# Patient Record
Sex: Female | Born: 1990 | Race: Black or African American | Hispanic: No | Marital: Single | Smoking: Never smoker
Health system: Southern US, Community
[De-identification: ages and names within clinical notes are randomized; demographics above are authoritative.]

## PROBLEM LIST (undated history)

## (undated) DIAGNOSIS — Z789 Other specified health status: Secondary | ICD-10-CM

## (undated) HISTORY — DX: Other specified health status: Z78.9

## (undated) HISTORY — PX: HERNIA REPAIR: SHX51

---

## 2011-10-06 ENCOUNTER — Emergency Department (HOSPITAL_COMMUNITY)
Admission: EM | Admit: 2011-10-06 | Discharge: 2011-10-06 | Disposition: A | Discharge status: Home / Self Care | Payer: BC Managed Care – PPO | Attending: Emergency Medicine | Admitting: Emergency Medicine

## 2011-10-06 ENCOUNTER — Encounter: Payer: Self-pay | Admitting: Emergency Medicine

## 2011-10-06 DIAGNOSIS — J111 Influenza due to unidentified influenza virus with other respiratory manifestations: Secondary | ICD-10-CM | POA: Insufficient documentation

## 2011-10-06 MED ORDER — IBUPROFEN 800 MG PO TABS
800.0000 mg | ORAL_TABLET | Freq: Once | ORAL | Status: AC
  Administered 2011-10-06: 800 mg via ORAL
  Filled 2011-10-06: qty 1

## 2011-10-06 MED ORDER — DIPHENHYDRAMINE HCL 25 MG PO CAPS
25.0000 mg | ORAL_CAPSULE | Freq: Once | ORAL | Status: AC
  Administered 2011-10-06: 25 mg via ORAL
  Filled 2011-10-06: qty 1

## 2011-10-06 MED ORDER — ALBUTEROL SULFATE HFA 108 (90 BASE) MCG/ACT IN AERS: 2.0000 | INHALATION_SPRAY | RESPIRATORY_TRACT | Status: DC | PRN

## 2011-10-06 NOTE — ED Notes (Signed)
Not feeling well for the past few days now states that she is getting better today, no fever at home, took some meds a few days ago

## 2011-10-07 NOTE — ED Provider Notes (Signed)
Medical screening examination/treatment/procedure(s) were performed by non-physician practitioner and as supervising physician I was immediately available for consultation/collaboration.   Nat Christen, MD 10/07/11 1318

## 2011-10-07 NOTE — ED Provider Notes (Signed)
History     CSN: [id number] Arrival date & time: 10/06/2011  2:57 PM   First MD Initiated Contact with Patient 10/06/11 1719      Chief Complaint  Patient presents with  . Cough  . Generalized Body Aches    (Consider location/radiation/quality/duration/timing/severity/associated sxs/prior treatment) HPI  Patient presents to emergency department complaining of a three-day history gradual onset of cough, runny nose, body aches, nasal congestion. Patient states she took over the counter cough and cold medication without improvement of symptoms earlier today. Patient denies aggravating or alleviating factors. Symptoms were gradual onset, persistent, and unchanging. Patient denies known fevers, chills, headache, neck stiffness, chest pain, or shortness of breath. Patient has no known medical problems and takes no medicines on a regular basis.  History reviewed. No pertinent past medical history.  History reviewed. No pertinent past surgical history.  No family history on file.  History  Substance Use Topics  . Smoking status: Not on file  . Smokeless tobacco: Not on file  . Alcohol Use: Not on file    OB History    Grav Para Term Preterm Abortions TAB SAB Ect Mult Living                  Review of Systems  All other systems reviewed and are negative.    Allergies  Review of patient's allergies indicates no known allergies.  Home Medications   Current Outpatient Rx  Name Route Sig Dispense Refill  . NYQUIL PO Oral Take 2 capsules by mouth every 6 (six) hours as needed. For cough.     . DAYQUIL PO Oral Take 30 mLs by mouth every 6 (six) hours as needed. For cough/pain.     . ALBUTEROL SULFATE HFA 108 (90 BASE) MCG/ACT IN AERS Inhalation Inhale 2 puffs into the lungs every 4 (four) hours as needed for wheezing. 1 Inhaler 0    BP 109/73  Pulse 81  Temp(Src) 98.9 F (37.2 C) (Oral)  Resp 17  SpO2 100%  Physical Exam  Vitals reviewed. Constitutional: She is  oriented to person, place, and time. She appears well-developed and well-nourished. No distress.  HENT:  Head: Normocephalic and atraumatic.  Right Ear: External ear normal.  Left Ear: External ear normal.  Nose: Nose normal.  Mouth/Throat: No oropharyngeal exudate.       Mild erythema of posterior pharynx and tonsils no tonsillar exudate or enlargement. Patent airway. Swallowing secretions well  Eyes: Conjunctivae and EOM are normal. Pupils are equal, round, and reactive to light.  Neck: Normal range of motion. Neck supple.  Cardiovascular: Normal rate, regular rhythm and normal heart sounds.  Exam reveals no gallop and no friction rub.   No murmur heard. Pulmonary/Chest: Effort normal and breath sounds normal. No respiratory distress. She has no wheezes. She has no rales. She exhibits no tenderness.  Abdominal: Soft. She exhibits no distension and no mass. There is no tenderness. There is no rebound and no guarding.  Lymphadenopathy:    She has no cervical adenopathy.  Neurological: She is alert and oriented to person, place, and time. She has normal reflexes.  Skin: Skin is warm and dry. No rash noted. She is not diaphoretic.  Psychiatric: She has a normal mood and affect.    ED Course  Procedures (including critical care time)  Labs Reviewed - No data to display No results found.   1. Influenza       MDM  Patient is afebrile and nontoxic-appearing. Patient has no  comorbidities. Patient complaining of flulike symptoms and appropriate for outpatient management.        Jenness [place], [place] 10/07/11 [phone]

## 2015-07-01 LAB — OB RESULTS CONSOLE GC/CHLAMYDIA
Chlamydia: NEGATIVE
Gonorrhea: NEGATIVE

## 2015-07-01 LAB — OB RESULTS CONSOLE HIV ANTIBODY (ROUTINE TESTING): HIV: NONREACTIVE

## 2015-07-01 LAB — OB RESULTS CONSOLE RUBELLA ANTIBODY, IGM: RUBELLA: IMMUNE

## 2015-07-01 LAB — OB RESULTS CONSOLE HEPATITIS B SURFACE ANTIGEN: HEP B S AG: NEGATIVE

## 2015-07-01 LAB — OB RESULTS CONSOLE RPR: RPR: NONREACTIVE

## 2015-07-01 LAB — OB RESULTS CONSOLE ABO/RH: RH TYPE: POSITIVE

## 2015-07-01 LAB — OB RESULTS CONSOLE HGB/HCT, BLOOD: Hemoglobin: 11.8 g/dL

## 2015-07-01 LAB — OB RESULTS CONSOLE ANTIBODY SCREEN: Antibody Screen: NEGATIVE

## 2015-07-01 LAB — OB RESULTS CONSOLE PLATELET COUNT: PLATELETS: 234 10*3/uL

## 2015-07-22 ENCOUNTER — Encounter: Payer: Self-pay | Admitting: Family Medicine

## 2015-07-22 ENCOUNTER — Ambulatory Visit (INDEPENDENT_AMBULATORY_CARE_PROVIDER_SITE_OTHER): Payer: BC Managed Care – PPO | Admitting: Family Medicine

## 2015-07-22 VITALS — BP 115/77 | HR 91 | Ht 62.0 in | Wt 163.0 lb

## 2015-07-22 DIAGNOSIS — Z23 Encounter for immunization: Secondary | ICD-10-CM | POA: Diagnosis not present

## 2015-07-22 DIAGNOSIS — Z3400 Encounter for supervision of normal first pregnancy, unspecified trimester: Secondary | ICD-10-CM | POA: Insufficient documentation

## 2015-07-22 DIAGNOSIS — L0292 Furuncle, unspecified: Secondary | ICD-10-CM

## 2015-07-22 DIAGNOSIS — Z3401 Encounter for supervision of normal first pregnancy, first trimester: Secondary | ICD-10-CM

## 2015-07-22 MED ORDER — AMOXICILLIN 500 MG PO CAPS: 500.0000 mg | ORAL_CAPSULE | Freq: Three times a day (TID) | ORAL | Status: DC

## 2015-07-22 NOTE — Patient Instructions (Signed)
Second Trimester of Pregnancy The second trimester is from week 13 through week 28, months 4 through 6. The second trimester is often a time when you feel your best. Your body has also adjusted to being pregnant, and you begin to feel better physically. Usually, morning sickness has lessened or quit completely, you may have more energy, and you may have an increase in appetite. The second trimester is also a time when the fetus is growing rapidly. At the end of the sixth month, the fetus is about 9 inches long and weighs about 1 pounds. You will likely begin to feel the baby move (quickening) between 18 and 20 weeks of the pregnancy. BODY CHANGES Your body goes through many changes during pregnancy. The changes vary from woman to woman.   Your weight will continue to increase. You will notice your lower abdomen bulging out.  You may begin to get stretch marks on your hips, abdomen, and breasts.  You may develop headaches that can be relieved by medicines approved by your health care provider.  You may urinate more often because the fetus is pressing on your bladder.  You may develop or continue to have heartburn as a result of your pregnancy.  You may develop constipation because certain hormones are causing the muscles that push waste through your intestines to slow down.  You may develop hemorrhoids or swollen, bulging veins (varicose veins).  You may have back pain because of the weight gain and pregnancy hormones relaxing your joints between the bones in your pelvis and as a result of a shift in weight and the muscles that support your balance.  Your breasts will continue to grow and be tender.  Your gums may bleed and may be sensitive to brushing and flossing.  Dark spots or blotches (chloasma, mask of pregnancy) may develop on your face. This will likely fade after the baby is born.  A dark line from your belly button to the pubic area (linea nigra) may appear. This will likely  fade after the baby is born.  You may have changes in your hair. These can include thickening of your hair, rapid growth, and changes in texture. Some women also have hair loss during or after pregnancy, or hair that feels dry or thin. Your hair will most likely return to normal after your baby is born. WHAT TO EXPECT AT YOUR PRENATAL VISITS During a routine prenatal visit:  You will be weighed to make sure you and the fetus are growing normally.  Your blood pressure will be taken.  Your abdomen will be measured to track your baby's growth.  The fetal heartbeat will be listened to.  Any test results from the previous visit will be discussed. Your health care provider may ask you:  How you are feeling.  If you are feeling the baby move.  If you have had any abnormal symptoms, such as leaking fluid, bleeding, severe headaches, or abdominal cramping.  If you have any questions. Other tests that may be performed during your second trimester include:  Blood tests that check for:  Low iron levels (anemia).  Gestational diabetes (between 24 and 28 weeks).  Rh antibodies.  Urine tests to check for infections, diabetes, or protein in the urine.  An ultrasound to confirm the proper growth and development of the baby.  An amniocentesis to check for possible genetic problems.  Fetal screens for spina bifida and Down syndrome. HOME CARE INSTRUCTIONS   Avoid all smoking, herbs, alcohol, and unprescribed   drugs. These chemicals affect the formation and growth of the baby.  Follow your health care provider's instructions regarding medicine use. There are medicines that are either safe or unsafe to take during pregnancy.  Exercise only as directed by your health care provider. Experiencing uterine cramps is a good sign to stop exercising.  Continue to eat regular, healthy meals.  Wear a good support bra for breast tenderness.  Do not use hot tubs, steam rooms, or saunas.  Wear  your seat belt at all times when driving.  Avoid raw meat, uncooked cheese, cat litter boxes, and soil used by cats. These carry germs that can cause birth defects in the baby.  Take your prenatal vitamins.  Try taking a stool softener (if your health care provider approves) if you develop constipation. Eat more high-fiber foods, such as fresh vegetables or fruit and whole grains. Drink plenty of fluids to keep your urine clear or pale yellow.  Take warm sitz baths to soothe any pain or discomfort caused by hemorrhoids. Use hemorrhoid cream if your health care provider approves.  If you develop varicose veins, wear support hose. Elevate your feet for 15 minutes, 3-4 times a day. Limit salt in your diet.  Avoid heavy lifting, wear low heel shoes, and practice good posture.  Rest with your legs elevated if you have leg cramps or low back pain.  Visit your dentist if you have not gone yet during your pregnancy. Use a soft toothbrush to brush your teeth and be gentle when you floss.  A sexual relationship may be continued unless your health care provider directs you otherwise.  Continue to go to all your prenatal visits as directed by your health care provider. SEEK MEDICAL CARE IF:   You have dizziness.  You have mild pelvic cramps, pelvic pressure, or nagging pain in the abdominal area.  You have persistent nausea, vomiting, or diarrhea.  You have a bad smelling vaginal discharge.  You have pain with urination. SEEK IMMEDIATE MEDICAL CARE IF:   You have a fever.  You are leaking fluid from your vagina.  You have spotting or bleeding from your vagina.  You have severe abdominal cramping or pain.  You have rapid weight gain or loss.  You have shortness of breath with chest pain.  You notice sudden or extreme swelling of your face, hands, ankles, feet, or legs.  You have not felt your baby move in over an hour.  You have severe headaches that do not go away with  medicine.  You have vision changes. Document Released: 10/12/2001 Document Revised: 10/23/2013 Document Reviewed: 12/19/2012 [employer] Patient Information 2015 [employer]. This information is not intended to replace advice given to you by your health care provider. Make sure you discuss any questions you have with your health care provider.  Breastfeeding Deciding to breastfeed is one of the best choices you can make for you and your baby. A change in hormones during pregnancy causes your breast tissue to grow and increases the number and size of your milk ducts. These hormones also allow proteins, sugars, and fats from your blood supply to make breast milk in your milk-producing glands. Hormones prevent breast milk from being released before your baby is born as well as prompt milk flow after birth. Once breastfeeding has begun, thoughts of your baby, as well as his or her sucking or crying, can stimulate the release of milk from your milk-producing glands.  BENEFITS OF BREASTFEEDING For Your Baby  Your first   milk (colostrum) helps your baby's digestive system function better.   There are antibodies in your milk that help your baby fight off infections.   Your baby has a lower incidence of asthma, allergies, and sudden infant death syndrome.   The nutrients in breast milk are better for your baby than infant formulas and are designed uniquely for your baby's needs.   Breast milk improves your baby's brain development.   Your baby is less likely to develop other conditions, such as childhood obesity, asthma, or type 2 diabetes mellitus.  For You   Breastfeeding helps to create a very special bond between you and your baby.   Breastfeeding is convenient. Breast milk is always available at the correct temperature and costs nothing.   Breastfeeding helps to burn calories and helps you lose the weight gained during pregnancy.   Breastfeeding makes your uterus contract to its  prepregnancy size faster and slows bleeding (lochia) after you give birth.   Breastfeeding helps to lower your risk of developing type 2 diabetes mellitus, osteoporosis, and breast or ovarian cancer later in life. SIGNS THAT YOUR BABY IS HUNGRY Early Signs of Hunger  Increased alertness or activity.  Stretching.  Movement of the head from side to side.  Movement of the head and opening of the mouth when the corner of the mouth or cheek is stroked (rooting).  Increased sucking sounds, smacking lips, cooing, sighing, or squeaking.  Hand-to-mouth movements.  Increased sucking of fingers or hands. Late Signs of Hunger  Fussing.  Intermittent crying. Extreme Signs of Hunger Signs of extreme hunger will require calming and consoling before your baby will be able to breastfeed successfully. Do not wait for the following signs of extreme hunger to occur before you initiate breastfeeding:   Restlessness.  A loud, strong cry.   Screaming. BREASTFEEDING BASICS Breastfeeding Initiation  Find a comfortable place to sit or lie down, with your neck and back well supported.  Place a pillow or rolled up blanket under your baby to bring him or her to the level of your breast (if you are seated). Nursing pillows are specially designed to help support your arms and your baby while you breastfeed.  Make sure that your baby's abdomen is facing your abdomen.   Gently massage your breast. With your fingertips, massage from your chest wall toward your nipple in a circular motion. This encourages milk flow. You may need to continue this action during the feeding if your milk flows slowly.  Support your breast with 4 fingers underneath and your thumb above your nipple. Make sure your fingers are well away from your nipple and your baby's mouth.   Stroke your baby's lips gently with your finger or nipple.   When your baby's mouth is open wide enough, quickly bring your baby to your breast,  placing your entire nipple and as much of the colored area around your nipple (areola) as possible into your baby's mouth.   More areola should be visible above your baby's upper lip than below the lower lip.   Your baby's tongue should be between his or her lower gum and your breast.   Ensure that your baby's mouth is correctly positioned around your nipple (latched). Your baby's lips should create a seal on your breast and be turned out (everted).  It is common for your baby to suck about 2-3 minutes in order to start the flow of breast milk. Latching Teaching your baby how to latch on to your breast   properly is very important. An improper latch can cause nipple pain and decreased milk supply for you and poor weight gain in your baby. Also, if your baby is not latched onto your nipple properly, he or she may swallow some air during feeding. This can make your baby fussy. Burping your baby when you switch breasts during the feeding can help to get rid of the air. However, teaching your baby to latch on properly is still the best way to prevent fussiness from swallowing air while breastfeeding. Signs that your baby has successfully latched on to your nipple:    Silent tugging or silent sucking, without causing you pain.   Swallowing heard between every 3-4 sucks.    Muscle movement above and in front of his or her ears while sucking.  Signs that your baby has not successfully latched on to nipple:   Sucking sounds or smacking sounds from your baby while breastfeeding.  Nipple pain. If you think your baby has not latched on correctly, slip your finger into the corner of your baby's mouth to break the suction and place it between your baby's gums. Attempt breastfeeding initiation again. Signs of Successful Breastfeeding Signs from your baby:   A gradual decrease in the number of sucks or complete cessation of sucking.   Falling asleep.   Relaxation of his or her body.    Retention of a small amount of milk in his or her mouth.   Letting go of your breast by himself or herself. Signs from you:  Breasts that have increased in firmness, weight, and size 1-3 hours after feeding.   Breasts that are softer immediately after breastfeeding.  Increased milk volume, as well as a change in milk consistency and color by the fifth day of breastfeeding.   Nipples that are not sore, cracked, or bleeding. Signs That Your Baby is Getting Enough Milk  Wetting at least 3 diapers in a 24-hour period. The urine should be clear and pale yellow by age 5 days.  At least 3 stools in a 24-hour period by age 5 days. The stool should be soft and yellow.  At least 3 stools in a 24-hour period by age 7 days. The stool should be seedy and yellow.  No loss of weight greater than 10% of birth weight during the first 3 days of age.  Average weight gain of 4-7 ounces (113-198 g) per week after age 4 days.  Consistent daily weight gain by age 5 days, without weight loss after the age of 2 weeks. After a feeding, your baby may spit up a small amount. This is common. BREASTFEEDING FREQUENCY AND DURATION Frequent feeding will help you make more milk and can prevent sore nipples and breast engorgement. Breastfeed when you feel the need to reduce the fullness of your breasts or when your baby shows signs of hunger. This is called "breastfeeding on demand." Avoid introducing a pacifier to your baby while you are working to establish breastfeeding (the first 4-6 weeks after your baby is born). After this time you may choose to use a pacifier. Research has shown that pacifier use during the first year of a baby's life decreases the risk of sudden infant death syndrome (SIDS). Allow your baby to feed on each breast as long as he or she wants. Breastfeed until your baby is finished feeding. When your baby unlatches or falls asleep while feeding from the first breast, offer the second breast.  Because newborns are often sleepy in the   first few weeks of life, you may need to awaken your baby to get him or her to feed. Breastfeeding times will vary from baby to baby. However, the following rules can serve as a guide to help you ensure that your baby is properly fed:  Newborns (babies 4 weeks of age or younger) may breastfeed every 1-3 hours.  Newborns should not go longer than 3 hours during the day or 5 hours during the night without breastfeeding.  You should breastfeed your baby a minimum of 8 times in a 24-hour period until you begin to introduce solid foods to your baby at around 6 months of age. BREAST MILK PUMPING Pumping and storing breast milk allows you to ensure that your baby is exclusively fed your breast milk, even at times when you are unable to breastfeed. This is especially important if you are going back to work while you are still breastfeeding or when you are not able to be present during feedings. Your lactation consultant can give you guidelines on how long it is safe to store breast milk.  A breast pump is a machine that allows you to pump milk from your breast into a sterile bottle. The pumped breast milk can then be stored in a refrigerator or freezer. Some breast pumps are operated by hand, while others use electricity. Ask your lactation consultant which type will work best for you. Breast pumps can be purchased, but some hospitals and breastfeeding support groups lease breast pumps on a monthly basis. A lactation consultant can teach you how to hand express breast milk, if you prefer not to use a pump.  CARING FOR YOUR BREASTS WHILE YOU BREASTFEED Nipples can become dry, cracked, and sore while breastfeeding. The following recommendations can help keep your breasts moisturized and healthy:  Avoid using soap on your nipples.   Wear a supportive bra. Although not required, special nursing bras and tank tops are designed to allow access to your breasts for  breastfeeding without taking off your entire bra or top. Avoid wearing underwire-style bras or extremely tight bras.  Air dry your nipples for 3-4minutes after each feeding.   Use only cotton bra pads to absorb leaked breast milk. Leaking of breast milk between feedings is normal.   Use lanolin on your nipples after breastfeeding. Lanolin helps to maintain your skin's normal moisture barrier. If you use pure lanolin, you do not need to wash it off before feeding your baby again. Pure lanolin is not toxic to your baby. You may also hand express a few drops of breast milk and gently massage that milk into your nipples and allow the milk to air dry. In the first few weeks after giving birth, some women experience extremely full breasts (engorgement). Engorgement can make your breasts feel heavy, warm, and tender to the touch. Engorgement peaks within 3-5 days after you give birth. The following recommendations can help ease engorgement:  Completely empty your breasts while breastfeeding or pumping. You may want to start by applying warm, moist heat (in the shower or with warm water-soaked hand towels) just before feeding or pumping. This increases circulation and helps the milk flow. If your baby does not completely empty your breasts while breastfeeding, pump any extra milk after he or she is finished.  Wear a snug bra (nursing or regular) or tank top for 1-2 days to signal your body to slightly decrease milk production.  Apply ice packs to your breasts, unless this is too uncomfortable for you.    Make sure that your baby is latched on and positioned properly while breastfeeding. If engorgement persists after 48 hours of following these recommendations, contact your health care provider or a lactation consultant. OVERALL HEALTH CARE RECOMMENDATIONS WHILE BREASTFEEDING  Eat healthy foods. Alternate between meals and snacks, eating 3 of each per day. Because what you eat affects your breast milk,  some of the foods may make your baby more irritable than usual. Avoid eating these foods if you are sure that they are negatively affecting your baby.  Drink milk, fruit juice, and water to satisfy your thirst (about 10 glasses a day).   Rest often, relax, and continue to take your prenatal vitamins to prevent fatigue, stress, and anemia.  Continue breast self-awareness checks.  Avoid chewing and smoking tobacco.  Avoid alcohol and drug use. Some medicines that may be harmful to your baby can pass through breast milk. It is important to ask your health care provider before taking any medicine, including all over-the-counter and prescription medicine as well as vitamin and herbal supplements. It is possible to become pregnant while breastfeeding. If birth control is desired, ask your health care provider about options that will be safe for your baby. SEEK MEDICAL CARE IF:   You feel like you want to stop breastfeeding or have become frustrated with breastfeeding.  You have painful breasts or nipples.  Your nipples are cracked or bleeding.  Your breasts are red, tender, or warm.  You have a swollen area on either breast.  You have a fever or chills.  You have nausea or vomiting.  You have drainage other than breast milk from your nipples.  Your breasts do not become full before feedings by the fifth day after you give birth.  You feel sad and depressed.  Your baby is too sleepy to eat well.  Your baby is having trouble sleeping.   Your baby is wetting less than 3 diapers in a 24-hour period.  Your baby has less than 3 stools in a 24-hour period.  Your baby's skin or the white part of his or her eyes becomes yellow.   Your baby is not gaining weight by 5 days of age. SEEK IMMEDIATE MEDICAL CARE IF:   Your baby is overly tired (lethargic) and does not want to wake up and feed.  Your baby develops an unexplained fever. Document Released: 10/18/2005 Document Revised:  10/23/2013 Document Reviewed: 04/11/2013 [employer] Patient Information 2015 [employer]. This information is not intended to replace advice given to you by your health care provider. Make sure you discuss any questions you have with your health care provider.  

## 2015-07-22 NOTE — Assessment & Plan Note (Signed)
BABYSCRIPTS PATIENT: [x ] initial, [ x] 12, [ ] 20, [ ] 28, [ ] 32, [ ] 36, [ ] 38, [ ] 39, [ ] 40 

## 2015-07-22 NOTE — Progress Notes (Signed)
Subjective:  Lisa Vincent is a 24 y.o. G2P0010 at [redacted]w[redacted]d being seen today for ongoing prenatal care. She is transferring from [facility]. Has had labs. Needs first screen.  Patient reports headache.  Contractions: Not present.  Vag. Bleeding: None. Movement: Absent. Denies leaking of fluid.   The following portions of the patient's history were reviewed and updated as appropriate: allergies, current medications, past family history, past medical history, past social history, past surgical history and problem list.   Objective:   Filed Vitals:   07/22/15 1023 07/22/15 1026  BP: 115/77   Pulse: 91   Height:   (1.575 m)  Weight: 163 lb (73.936 kg)     Fetal Status: Fetal Heart Rate (bpm): 162   Movement: Absent     General:  Alert, oriented and cooperative. Patient is in no acute distress.  Skin: Skin is warm and dry. No rash noted.   Cardiovascular: Normal heart rate noted  Respiratory: Normal respiratory effort, no problems with respiration noted  Abdomen: Soft, gravid, appropriate for gestational age. Pain/Pressure: Absent     Pelvic: Vag. Bleeding: None Vag D/C Character: Thin    Extremities: Normal range of motion.  Edema: None  Mental Status: Normal mood and affect. Normal behavior. Normal judgment and thought content.  Skin: large 3 x 3 cm indurated area on left butt cheek--no area for I & D  Urinalysis: Urine Protein: Negative Urine Glucose: Negative  Assessment and Plan:  Pregnancy: G2P0010 at [redacted]w[redacted]d  1. Supervision of normal first pregnancy, antepartum, first trimester Continue routine prenatal care. Signed up for Baby Scripts - US Fetal Nuchal Translucency Measurement; Future - Flu Vaccine QUAD 36+ mos IM (Fluarix, Quad PF)  2. Boil - amoxicillin (AMOXIL) 500 MG capsule; Take 1 capsule (500 mg total) by mouth 3 (three) times daily.  Dispense: 21 capsule; Refill: 0   Please refer to After Visit Summary for other counseling recommendations.  Return in 4 weeks (on  08/19/2015).   Reva Bores, MD

## 2015-07-23 ENCOUNTER — Encounter: Payer: Self-pay | Admitting: *Deleted

## 2015-07-30 ENCOUNTER — Ambulatory Visit (HOSPITAL_COMMUNITY)
Admission: RE | Admit: 2015-07-30 | Discharge: 2015-07-30 | Disposition: A | Discharge status: Home / Self Care | Payer: BC Managed Care – PPO | Source: Ambulatory Visit | Attending: Family Medicine | Admitting: Family Medicine

## 2015-07-30 ENCOUNTER — Other Ambulatory Visit: Payer: Self-pay | Admitting: Family Medicine

## 2015-07-30 ENCOUNTER — Encounter (HOSPITAL_COMMUNITY): Payer: Self-pay

## 2015-07-30 ENCOUNTER — Ambulatory Visit (HOSPITAL_COMMUNITY): Admission: RE | Admit: 2015-07-30 | Payer: BC Managed Care – PPO | Source: Ambulatory Visit

## 2015-07-30 DIAGNOSIS — Z36 Encounter for antenatal screening of mother: Secondary | ICD-10-CM | POA: Diagnosis not present

## 2015-07-30 DIAGNOSIS — Z3A13 13 weeks gestation of pregnancy: Secondary | ICD-10-CM

## 2015-07-30 DIAGNOSIS — Z369 Encounter for antenatal screening, unspecified: Secondary | ICD-10-CM

## 2015-07-30 DIAGNOSIS — Z3401 Encounter for supervision of normal first pregnancy, first trimester: Secondary | ICD-10-CM

## 2015-07-30 NOTE — ED Notes (Signed)
Pt states she had some upper abdominal pain last night.  Denies bleeding.

## 2015-08-01 ENCOUNTER — Ambulatory Visit (HOSPITAL_COMMUNITY): Admission: RE | Admit: 2015-08-01 | Payer: BC Managed Care – PPO | Source: Ambulatory Visit

## 2015-08-01 ENCOUNTER — Ambulatory Visit (HOSPITAL_COMMUNITY)
Admission: RE | Admit: 2015-08-01 | Discharge: 2015-08-01 | Disposition: A | Discharge status: Home / Self Care | Payer: BC Managed Care – PPO | Source: Ambulatory Visit | Attending: Family Medicine | Admitting: Family Medicine

## 2015-08-01 ENCOUNTER — Other Ambulatory Visit (HOSPITAL_COMMUNITY): Payer: Self-pay | Admitting: Obstetrics and Gynecology

## 2015-08-01 ENCOUNTER — Encounter (HOSPITAL_COMMUNITY): Payer: Self-pay

## 2015-08-01 DIAGNOSIS — Z369 Encounter for antenatal screening, unspecified: Secondary | ICD-10-CM

## 2015-08-01 DIAGNOSIS — Z3A13 13 weeks gestation of pregnancy: Secondary | ICD-10-CM | POA: Diagnosis not present

## 2015-08-01 DIAGNOSIS — Z36 Encounter for antenatal screening of mother: Secondary | ICD-10-CM | POA: Diagnosis present

## 2015-08-01 DIAGNOSIS — Z3401 Encounter for supervision of normal first pregnancy, first trimester: Secondary | ICD-10-CM

## 2015-08-06 ENCOUNTER — Telehealth: Payer: Self-pay | Admitting: *Deleted

## 2015-08-06 DIAGNOSIS — Z3492 Encounter for supervision of normal pregnancy, unspecified, second trimester: Secondary | ICD-10-CM

## 2015-08-06 NOTE — Telephone Encounter (Signed)
MFM called requesting an order for Anatomy US the was scheduled in November, placed order per protocol.

## 2015-08-12 ENCOUNTER — Encounter: Payer: Self-pay | Admitting: Physician Assistant

## 2015-08-12 ENCOUNTER — Ambulatory Visit (INDEPENDENT_AMBULATORY_CARE_PROVIDER_SITE_OTHER): Payer: BC Managed Care – PPO | Admitting: Physician Assistant

## 2015-08-12 VITALS — BP 115/74 | HR 87 | Resp 18 | Ht 62.0 in | Wt 167.0 lb

## 2015-08-12 DIAGNOSIS — R51 Headache: Secondary | ICD-10-CM | POA: Diagnosis not present

## 2015-08-12 DIAGNOSIS — O26899 Other specified pregnancy related conditions, unspecified trimester: Secondary | ICD-10-CM | POA: Insufficient documentation

## 2015-08-12 DIAGNOSIS — G43009 Migraine without aura, not intractable, without status migrainosus: Secondary | ICD-10-CM

## 2015-08-12 DIAGNOSIS — O26892 Other specified pregnancy related conditions, second trimester: Secondary | ICD-10-CM | POA: Diagnosis not present

## 2015-08-12 DIAGNOSIS — R519 Headache, unspecified: Secondary | ICD-10-CM | POA: Insufficient documentation

## 2015-08-12 DIAGNOSIS — M62838 Other muscle spasm: Secondary | ICD-10-CM | POA: Diagnosis not present

## 2015-08-12 MED ORDER — CYCLOBENZAPRINE HCL 10 MG PO TABS: 10.0000 mg | ORAL_TABLET | Freq: Three times a day (TID) | ORAL | Status: DC | PRN

## 2015-08-12 NOTE — Progress Notes (Signed)
Patient ID: Lisa Vincent, female   DOB: August 20, 1991, 24 y.o.   MRN: [id number] History:  Lisa Vincent is a 24 y.o. G2P0010 who presents to clinic today for new evaluation of headache.  She has noticed more severe headaches in pregnancy but headaches in general started around age 78 with menses.  The worse one was left temporal but other times it is throughout the head.  There was throbbing.  One time she awoke from a nap with throbbing headache.  They last the rest of the day but get better with overnight sleep (up to 12 hours).  They are worse with movement.   Phonophobia much more noticeable that photophobia.  She denies nausea/vomiting.  No change in vision.  No warning of HA.   Prior to pregnancy - she had menstrual headaches every month.   Reports she gets HA from hunger or not drinking enough.  She also notes more HA with stress.   HIT6:55 Number of days in the last 4 weeks with:  Severe headache: 2 Moderate headache: 0 Mild headache: 23  No headache: 3  History reviewed. No pertinent past medical history.  Social History   Social History  . Marital Status: Single    Spouse Name: N/A  . Number of Children: N/A  . Years of Education: N/A   Occupational History  . Not on file.   Social History Main Topics  . Smoking status: Never Smoker   . Smokeless tobacco: Never Used  . Alcohol Use: No  . Drug Use: No  . Sexual Activity: Yes    Birth Control/ Protection: None   Other Topics Concern  . Not on file   Social History Narrative    Family History  Problem Relation Age of Onset  . Hypertension Mother   . Hypertension Maternal Aunt   . Hypertension Maternal Uncle   . Hypertension Maternal Grandmother     No Known Allergies  Current Outpatient Prescriptions on File Prior to Visit  Medication Sig Dispense Refill  . Prenat-FeCbn-FeAsp-Meth-FA-DHA (PRENATE MINI) 18-0.6-0.4-350 MG CAPS Take by mouth.     No current facility-administered medications on file prior to visit.      Review of Systems:  All pertinent positive/negative included in HPI, all other review of systems are negative  Objective:  Physical Exam BP 115/74 mmHg  Pulse 87  Resp 18  Ht  (1.575 m)  Wt 167 lb (75.751 kg)  BMI 30.54 kg/m2  LMP 04/27/2015 (LMP Unknown) CONSTITUTIONAL: Well-developed, well-nourished female in no acute distress.  EYES: EOM intact ENT: Normocephalic CARDIOVASCULAR: Regular rate and rhythm with no adventitious sounds.  RESPIRATORY: Normal rate. Clear to auscultation bilaterally.  ENDOCRINE: Normal thyroid.  MUSCULOSKELETAL: Normal ROM, strength equal bilaterally, significant bilat trap muscle spasm noted SKIN: Warm, dry without erythema  NEUROLOGICAL: Alert, oriented, CN II-XII grossly intact, Appropriate balance, No dysmetria, Sensation equal bilaterally, Romberg negative.   PSYCH: Normal behavior, mood   Assessment & Plan:  Assessment: 1. Migraine without aura and without status migrainosus, not intractable   2. Headache in pregnancy, antepartum, second trimester   3. Muscle spasm    Plan: Drink one bottle of water every morning on awakening.   Exercise discussed - early am walking outside is optimal Schedule discussed.  Try to keep everything as routine as possible: eating, sleeping, exercise, etc Flexeril rx - for acute treatment of HA AND for muscle spasm - category B safety in pregnancy Can use OTC Benadryl  and go to bed if flexeril  does not resolve HA (also Cat B in preg) Keep HA diary.  Watch for triggers  Follow-up in 6 weeks or sooner PRN  Bertram Denver, PA-C 08/12/2015 11:00 AM

## 2015-08-12 NOTE — Patient Instructions (Signed)

## 2015-08-27 ENCOUNTER — Ambulatory Visit (INDEPENDENT_AMBULATORY_CARE_PROVIDER_SITE_OTHER): Payer: BC Managed Care – PPO | Admitting: Certified Nurse Midwife

## 2015-08-27 VITALS — BP 127/83 | HR 94 | Wt 174.0 lb

## 2015-08-27 DIAGNOSIS — Z3482 Encounter for supervision of other normal pregnancy, second trimester: Secondary | ICD-10-CM

## 2015-08-27 DIAGNOSIS — Z3492 Encounter for supervision of normal pregnancy, unspecified, second trimester: Secondary | ICD-10-CM

## 2015-08-27 NOTE — Patient Instructions (Signed)

## 2015-09-09 ENCOUNTER — Ambulatory Visit (HOSPITAL_COMMUNITY)
Admission: RE | Admit: 2015-09-09 | Discharge: 2015-09-09 | Disposition: A | Discharge status: Home / Self Care | Payer: BC Managed Care – PPO | Source: Ambulatory Visit | Attending: Family Medicine | Admitting: Family Medicine

## 2015-09-09 ENCOUNTER — Other Ambulatory Visit: Payer: Self-pay | Admitting: Family Medicine

## 2015-09-09 DIAGNOSIS — Z3492 Encounter for supervision of normal pregnancy, unspecified, second trimester: Secondary | ICD-10-CM

## 2015-09-09 DIAGNOSIS — Z3A19 19 weeks gestation of pregnancy: Secondary | ICD-10-CM | POA: Insufficient documentation

## 2015-09-09 DIAGNOSIS — Z3402 Encounter for supervision of normal first pregnancy, second trimester: Secondary | ICD-10-CM

## 2015-09-09 DIAGNOSIS — Z36 Encounter for antenatal screening of mother: Secondary | ICD-10-CM | POA: Diagnosis not present

## 2015-09-09 LAB — QUAD SCREEN FOR MFM

## 2015-09-10 ENCOUNTER — Telehealth: Payer: Self-pay | Admitting: *Deleted

## 2015-09-10 NOTE — Telephone Encounter (Signed)
[facility], needed verification on patient last weight to be able to run the lab.  Informed of current weight.

## 2015-09-12 ENCOUNTER — Ambulatory Visit (HOSPITAL_COMMUNITY): Payer: BC Managed Care – PPO

## 2015-09-16 ENCOUNTER — Ambulatory Visit (INDEPENDENT_AMBULATORY_CARE_PROVIDER_SITE_OTHER): Payer: BC Managed Care – PPO | Admitting: Family Medicine

## 2015-09-16 VITALS — BP 121/78 | HR 81 | Wt 174.0 lb

## 2015-09-16 DIAGNOSIS — Z3402 Encounter for supervision of normal first pregnancy, second trimester: Secondary | ICD-10-CM

## 2015-09-16 NOTE — Patient Instructions (Addendum)
Breastfeeding Deciding to breastfeed is one of the best choices you can make for you and your baby. A change in hormones during pregnancy causes your breast tissue to grow and increases the number and size of your milk ducts. These hormones also allow proteins, sugars, and fats from your blood supply to make breast milk in your milk-producing glands. Hormones prevent breast milk from being released before your baby is born as well as prompt milk flow after birth. Once breastfeeding has begun, thoughts of your baby, as well as his or her sucking or crying, can stimulate the release of milk from your milk-producing glands.  BENEFITS OF BREASTFEEDING For Your Baby  Your first milk (colostrum) helps your baby's digestive system function better.  There are antibodies in your milk that help your baby fight off infections.  Your baby has a lower incidence of asthma, allergies, and sudden infant death syndrome.  The nutrients in breast milk are better for your baby than infant formulas and are designed uniquely for your baby's needs.  Breast milk improves your baby's brain development.  Your baby is less likely to develop other conditions, such as childhood obesity, asthma, or type 2 diabetes mellitus. For You  Breastfeeding helps to create a very special bond between you and your baby.  Breastfeeding is convenient. Breast milk is always available at the correct temperature and costs nothing.  Breastfeeding helps to burn calories and helps you lose the weight gained during pregnancy.  Breastfeeding makes your uterus contract to its prepregnancy size faster and slows bleeding (lochia) after you give birth.   Breastfeeding helps to lower your risk of developing type 2 diabetes mellitus, osteoporosis, and breast or ovarian cancer later in life. SIGNS THAT YOUR BABY IS HUNGRY Early Signs of Hunger  Increased alertness or activity.  Stretching.  Movement of the head from side to  side.  Movement of the head and opening of the mouth when the corner of the mouth or cheek is stroked (rooting).  Increased sucking sounds, smacking lips, cooing, sighing, or squeaking.  Hand-to-mouth movements.  Increased sucking of fingers or hands. Late Signs of Hunger  Fussing.  Intermittent crying. Extreme Signs of Hunger Signs of extreme hunger will require calming and consoling before your baby will be able to breastfeed successfully. Do not wait for the following signs of extreme hunger to occur before you initiate breastfeeding:  Restlessness.  A loud, strong cry.  Screaming. BREASTFEEDING BASICS Breastfeeding Initiation  Find a comfortable place to sit or lie down, with your neck and back well supported.  Place a pillow or rolled up blanket under your baby to bring him or her to the level of your breast (if you are seated). Nursing pillows are specially designed to help support your arms and your baby while you breastfeed.  Make sure that your baby's abdomen is facing your abdomen.  Gently massage your breast. With your fingertips, massage from your chest wall toward your nipple in a circular motion. This encourages milk flow. You may need to continue this action during the feeding if your milk flows slowly.  Support your breast with 4 fingers underneath and your thumb above your nipple. Make sure your fingers are well away from your nipple and your baby's mouth.  Stroke your baby's lips gently with your finger or nipple.  When your baby's mouth is open wide enough, quickly bring your baby to your breast, placing your entire nipple and as much of the colored area around your nipple (  areola) as possible into your baby's mouth.  More areola should be visible above your baby's upper lip than below the lower lip.  Your baby's tongue should be between his or her lower gum and your breast.  Ensure that your baby's mouth is correctly positioned around your nipple  (latched). Your baby's lips should create a seal on your breast and be turned out (everted).  It is common for your baby to suck about 2-3 minutes in order to start the flow of breast milk. Latching Teaching your baby how to latch on to your breast properly is very important. An improper latch can cause nipple pain and decreased milk supply for you and poor weight gain in your baby. Also, if your baby is not latched onto your nipple properly, he or she may swallow some air during feeding. This can make your baby fussy. Burping your baby when you switch breasts during the feeding can help to get rid of the air. However, teaching your baby to latch on properly is still the best way to prevent fussiness from swallowing air while breastfeeding. Signs that your baby has successfully latched on to your nipple:  Silent tugging or silent sucking, without causing you pain.  Swallowing heard between every 3-4 sucks.  Muscle movement above and in front of his or her ears while sucking. Signs that your baby has not successfully latched on to nipple:  Sucking sounds or smacking sounds from your baby while breastfeeding.  Nipple pain. If you think your baby has not latched on correctly, slip your finger into the corner of your baby's mouth to break the suction and place it between your baby's gums. Attempt breastfeeding initiation again. Signs of Successful Breastfeeding Signs from your baby:  A gradual decrease in the number of sucks or complete cessation of sucking.  Falling asleep.  Relaxation of his or her body.  Retention of a small amount of milk in his or her mouth.  Letting go of your breast by himself or herself. Signs from you:  Breasts that have increased in firmness, weight, and size 1-3 hours after feeding.  Breasts that are softer immediately after breastfeeding.  Increased milk volume, as well as a change in milk consistency and color by the fifth day of breastfeeding.  Nipples  that are not sore, cracked, or bleeding. Signs That Your Baby is Getting Enough Milk  Wetting at least 3 diapers in a 24-hour period. The urine should be clear and pale yellow by age 5 days.  At least 3 stools in a 24-hour period by age 5 days. The stool should be soft and yellow.  At least 3 stools in a 24-hour period by age 7 days. The stool should be seedy and yellow.  No loss of weight greater than 10% of birth weight during the first 3 days of age.  Average weight gain of 4-7 ounces (113-198 g) per week after age 4 days.  Consistent daily weight gain by age 5 days, without weight loss after the age of 2 weeks. After a feeding, your baby may spit up a small amount. This is common. BREASTFEEDING FREQUENCY AND DURATION Frequent feeding will help you make more milk and can prevent sore nipples and breast engorgement. Breastfeed when you feel the need to reduce the fullness of your breasts or when your baby shows signs of hunger. This is called "breastfeeding on demand." Avoid introducing a pacifier to your baby while you are working to establish breastfeeding (the first 4-6 weeks   after your baby is born). After this time you may choose to use a pacifier. Research has shown that pacifier use during the first year of a baby's life decreases the risk of sudden infant death syndrome (SIDS). Allow your baby to feed on each breast as long as he or she wants. Breastfeed until your baby is finished feeding. When your baby unlatches or falls asleep while feeding from the first breast, offer the second breast. Because newborns are often sleepy in the first few weeks of life, you may need to awaken your baby to get him or her to feed. Breastfeeding times will vary from baby to baby. However, the following rules can serve as a guide to help you ensure that your baby is properly fed:  Newborns (babies 4 weeks of age or younger) may breastfeed every 1-3 hours.  Newborns should not go longer than 3 hours  during the day or 5 hours during the night without breastfeeding.  You should breastfeed your baby a minimum of 8 times in a 24-hour period until you begin to introduce solid foods to your baby at around 6 months of age. BREAST MILK PUMPING Pumping and storing breast milk allows you to ensure that your baby is exclusively fed your breast milk, even at times when you are unable to breastfeed. This is especially important if you are going back to work while you are still breastfeeding or when you are not able to be present during feedings. Your lactation consultant can give you guidelines on how long it is safe to store breast milk. A breast pump is a machine that allows you to pump milk from your breast into a sterile bottle. The pumped breast milk can then be stored in a refrigerator or freezer. Some breast pumps are operated by hand, while others use electricity. Ask your lactation consultant which type will work best for you. Breast pumps can be purchased, but some hospitals and breastfeeding support groups lease breast pumps on a monthly basis. A lactation consultant can teach you how to hand express breast milk, if you prefer not to use a pump. CARING FOR YOUR BREASTS WHILE YOU BREASTFEED Nipples can become dry, cracked, and sore while breastfeeding. The following recommendations can help keep your breasts moisturized and healthy:  Avoid using soap on your nipples.  Wear a supportive bra. Although not required, special nursing bras and tank tops are designed to allow access to your breasts for breastfeeding without taking off your entire bra or top. Avoid wearing underwire-style bras or extremely tight bras.  Air dry your nipples for 3-4minutes after each feeding.  Use only cotton bra pads to absorb leaked breast milk. Leaking of breast milk between feedings is normal.  Use lanolin on your nipples after breastfeeding. Lanolin helps to maintain your skin's normal moisture barrier. If you use  pure lanolin, you do not need to wash it off before feeding your baby again. Pure lanolin is not toxic to your baby. You may also hand express a few drops of breast milk and gently massage that milk into your nipples and allow the milk to air dry. In the first few weeks after giving birth, some women experience extremely full breasts (engorgement). Engorgement can make your breasts feel heavy, warm, and tender to the touch. Engorgement peaks within 3-5 days after you give birth. The following recommendations can help ease engorgement:  Completely empty your breasts while breastfeeding or pumping. You may want to start by applying warm, moist heat (in   the shower or with warm water-soaked hand towels) just before feeding or pumping. This increases circulation and helps the milk flow. If your baby does not completely empty your breasts while breastfeeding, pump any extra milk after he or she is finished.  Wear a snug bra (nursing or regular) or tank top for 1-2 days to signal your body to slightly decrease milk production.  Apply ice packs to your breasts, unless this is too uncomfortable for you.  Make sure that your baby is latched on and positioned properly while breastfeeding. If engorgement persists after 48 hours of following these recommendations, contact your health care provider or a lactation consultant. OVERALL HEALTH CARE RECOMMENDATIONS WHILE BREASTFEEDING  Eat healthy foods. Alternate between meals and snacks, eating 3 of each per day. Because what you eat affects your breast milk, some of the foods may make your baby more irritable than usual. Avoid eating these foods if you are sure that they are negatively affecting your baby.  Drink milk, fruit juice, and water to satisfy your thirst (about 10 glasses a day).  Rest often, relax, and continue to take your prenatal vitamins to prevent fatigue, stress, and anemia.  Continue breast self-awareness checks.  Avoid chewing and smoking  tobacco. Chemicals from cigarettes that pass into breast milk and exposure to secondhand smoke may harm your baby.  Avoid alcohol and drug use, including marijuana. Some medicines that may be harmful to your baby can pass through breast milk. It is important to ask your health care provider before taking any medicine, including all over-the-counter and prescription medicine as well as vitamin and herbal supplements. It is possible to become pregnant while breastfeeding. If birth control is desired, ask your health care provider about options that will be safe for your baby. SEEK MEDICAL CARE IF:  You feel like you want to stop breastfeeding or have become frustrated with breastfeeding.  You have painful breasts or nipples.  Your nipples are cracked or bleeding.  Your breasts are red, tender, or warm.  You have a swollen area on either breast.  You have a fever or chills.  You have nausea or vomiting.  You have drainage other than breast milk from your nipples.  Your breasts do not become full before feedings by the fifth day after you give birth.  You feel sad and depressed.  Your baby is too sleepy to eat well.  Your baby is having trouble sleeping.   Your baby is wetting less than 3 diapers in a 24-hour period.  Your baby has less than 3 stools in a 24-hour period.  Your baby's skin or the white part of his or her eyes becomes yellow.   Your baby is not gaining weight by 5 days of age. SEEK IMMEDIATE MEDICAL CARE IF:  Your baby is overly tired (lethargic) and does not want to wake up and feed.  Your baby develops an unexplained fever.   This information is not intended to replace advice given to you by your health care provider. Make sure you discuss any questions you have with your health care provider.   Document Released: 10/18/2005 Document Revised: 07/09/2015 Document Reviewed: 04/11/2013 Elsevier Interactive Patient Education 2016 [employer]  

## 2015-09-16 NOTE — Progress Notes (Signed)
Subjective:  Lisa Vincent is a 24 y.o. G2P0010 at 2125w2d being seen today for ongoing prenatal care.  Patient reports no complaints.  Contractions: Not present.  Vag. Bleeding: None. Movement: Present. Denies leaking of fluid. Baby Scripts patient, doing well.  The following portions of the patient's history were reviewed and updated as appropriate: allergies, current medications, past family history, past medical history, past social history, past surgical history and problem list. Problem list updated.  Objective:   Filed Vitals:   09/16/15 1013  BP: 121/78  Pulse: 81  Weight: 174 lb (78.926 kg)    Fetal Status: Fetal Heart Rate (bpm): 140 Fundal Height: 21 cm Movement: Present     General:  Alert, oriented and cooperative. Patient is in no acute distress.  Skin: Skin is warm and dry. No rash noted.   Cardiovascular: Normal heart rate noted  Respiratory: Normal respiratory effort, no problems with respiration noted  Abdomen: Soft, gravid, appropriate for gestational age. Pain/Pressure: Absent     Pelvic: Vag. Bleeding: None Vag D/C Character: Thin   Cervical exam deferred        Extremities: Normal range of motion.  Edema: None  Mental Status: Normal mood and affect. Normal behavior. Normal judgment and thought content.   Urinalysis: Urine Protein: Negative Urine Glucose: Negative  Assessment and Plan:  Pregnancy: G2P0010 at 7225w2d  1. Supervision of normal first pregnancy, antepartum, second trimester Continue routine prenatal care.  - US MFM OB FOLLOW UP; Future to complete heart views  Preterm labor symptoms and general obstetric precautions including but not limited to vaginal bleeding, contractions, leaking of fluid and fetal movement were reviewed in detail with the patient. Please refer to After Visit Summary for other counseling recommendations.  Return in 8 weeks (on 11/11/2015).   Reva Boresanya S Lattie Cervi, MD

## 2015-09-23 ENCOUNTER — Encounter: Payer: BC Managed Care – PPO | Admitting: Physician Assistant

## 2015-09-26 ENCOUNTER — Other Ambulatory Visit (HOSPITAL_COMMUNITY): Payer: Self-pay

## 2015-10-14 ENCOUNTER — Ambulatory Visit (HOSPITAL_COMMUNITY)
Admission: RE | Admit: 2015-10-14 | Discharge: 2015-10-14 | Disposition: A | Discharge status: Home / Self Care | Payer: BC Managed Care – PPO | Source: Ambulatory Visit | Attending: Family Medicine | Admitting: Family Medicine

## 2015-10-14 ENCOUNTER — Other Ambulatory Visit: Payer: Self-pay | Admitting: Family Medicine

## 2015-10-14 DIAGNOSIS — IMO0002 Reserved for concepts with insufficient information to code with codable children: Secondary | ICD-10-CM

## 2015-10-14 DIAGNOSIS — Z36 Encounter for antenatal screening of mother: Secondary | ICD-10-CM | POA: Insufficient documentation

## 2015-10-14 DIAGNOSIS — Z3A24 24 weeks gestation of pregnancy: Secondary | ICD-10-CM

## 2015-10-14 DIAGNOSIS — Z0489 Encounter for examination and observation for other specified reasons: Secondary | ICD-10-CM

## 2015-10-14 DIAGNOSIS — Z3402 Encounter for supervision of normal first pregnancy, second trimester: Secondary | ICD-10-CM

## 2015-11-11 ENCOUNTER — Ambulatory Visit (INDEPENDENT_AMBULATORY_CARE_PROVIDER_SITE_OTHER): Payer: BC Managed Care – PPO | Admitting: Family Medicine

## 2015-11-11 VITALS — BP 114/78 | HR 96 | Wt 189.0 lb

## 2015-11-11 DIAGNOSIS — Z3493 Encounter for supervision of normal pregnancy, unspecified, third trimester: Secondary | ICD-10-CM

## 2015-11-11 DIAGNOSIS — Z36 Encounter for antenatal screening of mother: Secondary | ICD-10-CM | POA: Diagnosis not present

## 2015-11-11 DIAGNOSIS — Z23 Encounter for immunization: Secondary | ICD-10-CM | POA: Diagnosis not present

## 2015-11-11 DIAGNOSIS — Z3483 Encounter for supervision of other normal pregnancy, third trimester: Secondary | ICD-10-CM

## 2015-11-11 DIAGNOSIS — Z3403 Encounter for supervision of normal first pregnancy, third trimester: Secondary | ICD-10-CM

## 2015-11-11 LAB — CBC
HCT: 32.6 % — ABNORMAL LOW (ref 36.0–46.0)
HEMOGLOBIN: 11.1 g/dL — AB (ref 12.0–15.0)
MCH: 33.3 pg (ref 26.0–34.0)
MCHC: 34 g/dL (ref 30.0–36.0)
MCV: 97.9 fL (ref 78.0–100.0)
MPV: 10.9 fL (ref 8.6–12.4)
PLATELETS: 159 10*3/uL (ref 150–400)
RBC: 3.33 MIL/uL — ABNORMAL LOW (ref 3.87–5.11)
RDW: 13.6 % (ref 11.5–15.5)
WBC: 6.6 10*3/uL (ref 4.0–10.5)

## 2015-11-11 NOTE — Patient Instructions (Signed)
Breastfeeding Deciding to breastfeed is one of the best choices you can make for you and your baby. A change in hormones during pregnancy causes your breast tissue to grow and increases the number and size of your milk ducts. These hormones also allow proteins, sugars, and fats from your blood supply to make breast milk in your milk-producing glands. Hormones prevent breast milk from being released before your baby is born as well as prompt milk flow after birth. Once breastfeeding has begun, thoughts of your baby, as well as his or her sucking or crying, can stimulate the release of milk from your milk-producing glands.  BENEFITS OF BREASTFEEDING For Your Baby  Your first milk (colostrum) helps your baby's digestive system function better.  There are antibodies in your milk that help your baby fight off infections.  Your baby has a lower incidence of asthma, allergies, and sudden infant death syndrome.  The nutrients in breast milk are better for your baby than infant formulas and are designed uniquely for your baby's needs.  Breast milk improves your baby's brain development.  Your baby is less likely to develop other conditions, such as childhood obesity, asthma, or type 2 diabetes mellitus. For You  Breastfeeding helps to create a very special bond between you and your baby.  Breastfeeding is convenient. Breast milk is always available at the correct temperature and costs nothing.  Breastfeeding helps to burn calories and helps you lose the weight gained during pregnancy.  Breastfeeding makes your uterus contract to its prepregnancy size faster and slows bleeding (lochia) after you give birth.   Breastfeeding helps to lower your risk of developing type 2 diabetes mellitus, osteoporosis, and breast or ovarian cancer later in life. SIGNS THAT YOUR BABY IS HUNGRY Early Signs of Hunger  Increased alertness or activity.  Stretching.  Movement of the head from side to  side.  Movement of the head and opening of the mouth when the corner of the mouth or cheek is stroked (rooting).  Increased sucking sounds, smacking lips, cooing, sighing, or squeaking.  Hand-to-mouth movements.  Increased sucking of fingers or hands. Late Signs of Hunger  Fussing.  Intermittent crying. Extreme Signs of Hunger Signs of extreme hunger will require calming and consoling before your baby will be able to breastfeed successfully. Do not wait for the following signs of extreme hunger to occur before you initiate breastfeeding:  Restlessness.  A loud, strong cry.  Screaming. BREASTFEEDING BASICS Breastfeeding Initiation  Find a comfortable place to sit or lie down, with your neck and back well supported.  Place a pillow or rolled up blanket under your baby to bring him or her to the level of your breast (if you are seated). Nursing pillows are specially designed to help support your arms and your baby while you breastfeed.  Make sure that your baby's abdomen is facing your abdomen.  Gently massage your breast. With your fingertips, massage from your chest wall toward your nipple in a circular motion. This encourages milk flow. You may need to continue this action during the feeding if your milk flows slowly.  Support your breast with 4 fingers underneath and your thumb above your nipple. Make sure your fingers are well away from your nipple and your baby's mouth.  Stroke your baby's lips gently with your finger or nipple.  When your baby's mouth is open wide enough, quickly bring your baby to your breast, placing your entire nipple and as much of the colored area around your nipple (  areola) as possible into your baby's mouth.  More areola should be visible above your baby's upper lip than below the lower lip.  Your baby's tongue should be between his or her lower gum and your breast.  Ensure that your baby's mouth is correctly positioned around your nipple  (latched). Your baby's lips should create a seal on your breast and be turned out (everted).  It is common for your baby to suck about 2-3 minutes in order to start the flow of breast milk. Latching Teaching your baby how to latch on to your breast properly is very important. An improper latch can cause nipple pain and decreased milk supply for you and poor weight gain in your baby. Also, if your baby is not latched onto your nipple properly, he or she may swallow some air during feeding. This can make your baby fussy. Burping your baby when you switch breasts during the feeding can help to get rid of the air. However, teaching your baby to latch on properly is still the best way to prevent fussiness from swallowing air while breastfeeding. Signs that your baby has successfully latched on to your nipple:  Silent tugging or silent sucking, without causing you pain.  Swallowing heard between every 3-4 sucks.  Muscle movement above and in front of his or her ears while sucking. Signs that your baby has not successfully latched on to nipple:  Sucking sounds or smacking sounds from your baby while breastfeeding.  Nipple pain. If you think your baby has not latched on correctly, slip your finger into the corner of your baby's mouth to break the suction and place it between your baby's gums. Attempt breastfeeding initiation again. Signs of Successful Breastfeeding Signs from your baby:  A gradual decrease in the number of sucks or complete cessation of sucking.  Falling asleep.  Relaxation of his or her body.  Retention of a small amount of milk in his or her mouth.  Letting go of your breast by himself or herself. Signs from you:  Breasts that have increased in firmness, weight, and size 1-3 hours after feeding.  Breasts that are softer immediately after breastfeeding.  Increased milk volume, as well as a change in milk consistency and color by the fifth day of breastfeeding.  Nipples  that are not sore, cracked, or bleeding. Signs That Your Baby is Getting Enough Milk  Wetting at least 3 diapers in a 24-hour period. The urine should be clear and pale yellow by age 5 days.  At least 3 stools in a 24-hour period by age 5 days. The stool should be soft and yellow.  At least 3 stools in a 24-hour period by age 7 days. The stool should be seedy and yellow.  No loss of weight greater than 10% of birth weight during the first 3 days of age.  Average weight gain of 4-7 ounces (113-198 g) per week after age 4 days.  Consistent daily weight gain by age 5 days, without weight loss after the age of 2 weeks. After a feeding, your baby may spit up a small amount. This is common. BREASTFEEDING FREQUENCY AND DURATION Frequent feeding will help you make more milk and can prevent sore nipples and breast engorgement. Breastfeed when you feel the need to reduce the fullness of your breasts or when your baby shows signs of hunger. This is called "breastfeeding on demand." Avoid introducing a pacifier to your baby while you are working to establish breastfeeding (the first 4-6 weeks   after your baby is born). After this time you may choose to use a pacifier. Research has shown that pacifier use during the first year of a baby's life decreases the risk of sudden infant death syndrome (SIDS). Allow your baby to feed on each breast as long as he or she wants. Breastfeed until your baby is finished feeding. When your baby unlatches or falls asleep while feeding from the first breast, offer the second breast. Because newborns are often sleepy in the first few weeks of life, you may need to awaken your baby to get him or her to feed. Breastfeeding times will vary from baby to baby. However, the following rules can serve as a guide to help you ensure that your baby is properly fed:  Newborns (babies 4 weeks of age or younger) may breastfeed every 1-3 hours.  Newborns should not go longer than 3 hours  during the day or 5 hours during the night without breastfeeding.  You should breastfeed your baby a minimum of 8 times in a 24-hour period until you begin to introduce solid foods to your baby at around 6 months of age. BREAST MILK PUMPING Pumping and storing breast milk allows you to ensure that your baby is exclusively fed your breast milk, even at times when you are unable to breastfeed. This is especially important if you are going back to work while you are still breastfeeding or when you are not able to be present during feedings. Your lactation consultant can give you guidelines on how long it is safe to store breast milk. A breast pump is a machine that allows you to pump milk from your breast into a sterile bottle. The pumped breast milk can then be stored in a refrigerator or freezer. Some breast pumps are operated by hand, while others use electricity. Ask your lactation consultant which type will work best for you. Breast pumps can be purchased, but some hospitals and breastfeeding support groups lease breast pumps on a monthly basis. A lactation consultant can teach you how to hand express breast milk, if you prefer not to use a pump. CARING FOR YOUR BREASTS WHILE YOU BREASTFEED Nipples can become dry, cracked, and sore while breastfeeding. The following recommendations can help keep your breasts moisturized and healthy:  Avoid using soap on your nipples.  Wear a supportive bra. Although not required, special nursing bras and tank tops are designed to allow access to your breasts for breastfeeding without taking off your entire bra or top. Avoid wearing underwire-style bras or extremely tight bras.  Air dry your nipples for 3-4minutes after each feeding.  Use only cotton bra pads to absorb leaked breast milk. Leaking of breast milk between feedings is normal.  Use lanolin on your nipples after breastfeeding. Lanolin helps to maintain your skin's normal moisture barrier. If you use  pure lanolin, you do not need to wash it off before feeding your baby again. Pure lanolin is not toxic to your baby. You may also hand express a few drops of breast milk and gently massage that milk into your nipples and allow the milk to air dry. In the first few weeks after giving birth, some women experience extremely full breasts (engorgement). Engorgement can make your breasts feel heavy, warm, and tender to the touch. Engorgement peaks within 3-5 days after you give birth. The following recommendations can help ease engorgement:  Completely empty your breasts while breastfeeding or pumping. You may want to start by applying warm, moist heat (in   the shower or with warm water-soaked hand towels) just before feeding or pumping. This increases circulation and helps the milk flow. If your baby does not completely empty your breasts while breastfeeding, pump any extra milk after he or she is finished.  Wear a snug bra (nursing or regular) or tank top for 1-2 days to signal your body to slightly decrease milk production.  Apply ice packs to your breasts, unless this is too uncomfortable for you.  Make sure that your baby is latched on and positioned properly while breastfeeding. If engorgement persists after 48 hours of following these recommendations, contact your health care provider or a lactation consultant. OVERALL HEALTH CARE RECOMMENDATIONS WHILE BREASTFEEDING  Eat healthy foods. Alternate between meals and snacks, eating 3 of each per day. Because what you eat affects your breast milk, some of the foods may make your baby more irritable than usual. Avoid eating these foods if you are sure that they are negatively affecting your baby.  Drink milk, fruit juice, and water to satisfy your thirst (about 10 glasses a day).  Rest often, relax, and continue to take your prenatal vitamins to prevent fatigue, stress, and anemia.  Continue breast self-awareness checks.  Avoid chewing and smoking  tobacco. Chemicals from cigarettes that pass into breast milk and exposure to secondhand smoke may harm your baby.  Avoid alcohol and drug use, including marijuana. Some medicines that may be harmful to your baby can pass through breast milk. It is important to ask your health care provider before taking any medicine, including all over-the-counter and prescription medicine as well as vitamin and herbal supplements. It is possible to become pregnant while breastfeeding. If birth control is desired, ask your health care provider about options that will be safe for your baby. SEEK MEDICAL CARE IF:  You feel like you want to stop breastfeeding or have become frustrated with breastfeeding.  You have painful breasts or nipples.  Your nipples are cracked or bleeding.  Your breasts are red, tender, or warm.  You have a swollen area on either breast.  You have a fever or chills.  You have nausea or vomiting.  You have drainage other than breast milk from your nipples.  Your breasts do not become full before feedings by the fifth day after you give birth.  You feel sad and depressed.  Your baby is too sleepy to eat well.  Your baby is having trouble sleeping.   Your baby is wetting less than 3 diapers in a 24-hour period.  Your baby has less than 3 stools in a 24-hour period.  Your baby's skin or the white part of his or her eyes becomes yellow.   Your baby is not gaining weight by 5 days of age. SEEK IMMEDIATE MEDICAL CARE IF:  Your baby is overly tired (lethargic) and does not want to wake up and feed.  Your baby develops an unexplained fever.   This information is not intended to replace advice given to you by your health care provider. Make sure you discuss any questions you have with your health care provider.   Document Released: 10/18/2005 Document Revised: 07/09/2015 Document Reviewed: 04/11/2013 Elsevier Interactive Patient Education 2016 [employer]  

## 2015-11-11 NOTE — Progress Notes (Signed)
Subjective:  Lisa Vincent is a 25 y.o. G2P0010 at [redacted]w[redacted]d being seen today for ongoing prenatal care.  She is currently monitored for the following issues for this low-risk pregnancy and has Supervision of normal first pregnancy, antepartum; Migraine without aura and without status migrainosus, not intractable; Headache in pregnancy, antepartum; and Muscle spasm on her problem list. She is on [employer].  Patient reports no complaints.  Contractions: Not present. Vag. Bleeding: None.  Movement: Present. Denies leaking of fluid.   The following portions of the patient's history were reviewed and updated as appropriate: allergies, current medications, past family history, past medical history, past social history, past surgical history and problem list. Problem list updated.  Objective:   Filed Vitals:   11/11/15 1031  BP: 114/78  Pulse: 96  Weight: 189 lb (85.73 kg)    Fetal Status: Fetal Heart Rate (bpm): 150 Fundal Height: 30 cm Movement: Present     General:  Alert, oriented and cooperative. Patient is in no acute distress.  Skin: Skin is warm and dry. No rash noted.   Cardiovascular: Normal heart rate noted  Respiratory: Normal respiratory effort, no problems with respiration noted  Abdomen: Soft, gravid, appropriate for gestational age. Pain/Pressure: Absent     Pelvic: Vag. Bleeding: None Vag D/C Character: Thin    Extremities: Normal range of motion.  Edema: None  Mental Status: Normal mood and affect. Normal behavior. Normal judgment and thought content.   Urinalysis: Urine Protein: Negative Urine Glucose: Negative  Assessment and Plan:  Pregnancy: G2P0010 at [redacted]w[redacted]d  1. Supervision of normal pregnancy in third trimester Continue routine prenatal care.  - Tdap vaccine greater than or equal to 7yo IM - CBC - Glucose Tolerance, 1 HR (50g) - RPR - HIV antibody   Preterm labor symptoms and general obstetric precautions including but not limited to vaginal bleeding, contractions,  leaking of fluid and fetal movement were reviewed in detail with the patient. Please refer to After Visit Summary for other counseling recommendations.  Return in 4 weeks (on 12/09/2015).   Reva Bores, MD

## 2015-11-12 LAB — HIV ANTIBODY (ROUTINE TESTING W REFLEX): HIV 1&2 Ab, 4th Generation: NONREACTIVE

## 2015-11-12 LAB — GLUCOSE TOLERANCE, 1 HOUR (50G) W/O FASTING: GLUCOSE 1 HOUR GTT: 112 mg/dL (ref 70–140)

## 2015-11-13 LAB — RPR

## 2015-12-09 ENCOUNTER — Ambulatory Visit (INDEPENDENT_AMBULATORY_CARE_PROVIDER_SITE_OTHER): Payer: BC Managed Care – PPO | Admitting: Obstetrics & Gynecology

## 2015-12-09 VITALS — BP 122/83 | HR 86 | Wt 194.0 lb

## 2015-12-09 DIAGNOSIS — O26893 Other specified pregnancy related conditions, third trimester: Secondary | ICD-10-CM

## 2015-12-09 DIAGNOSIS — R51 Headache: Secondary | ICD-10-CM

## 2015-12-09 DIAGNOSIS — Z3403 Encounter for supervision of normal first pregnancy, third trimester: Secondary | ICD-10-CM

## 2016-01-06 ENCOUNTER — Encounter: Payer: Self-pay | Admitting: Obstetrics & Gynecology

## 2016-01-06 ENCOUNTER — Ambulatory Visit (INDEPENDENT_AMBULATORY_CARE_PROVIDER_SITE_OTHER): Payer: BC Managed Care – PPO | Admitting: Obstetrics & Gynecology

## 2016-01-06 VITALS — BP 111/78 | HR 106 | Wt 192.0 lb

## 2016-01-06 DIAGNOSIS — R51 Headache: Secondary | ICD-10-CM

## 2016-01-06 DIAGNOSIS — O26893 Other specified pregnancy related conditions, third trimester: Secondary | ICD-10-CM

## 2016-01-06 DIAGNOSIS — Z113 Encounter for screening for infections with a predominantly sexual mode of transmission: Secondary | ICD-10-CM | POA: Diagnosis not present

## 2016-01-06 DIAGNOSIS — Z36 Encounter for antenatal screening of mother: Secondary | ICD-10-CM

## 2016-01-06 DIAGNOSIS — Z3403 Encounter for supervision of normal first pregnancy, third trimester: Secondary | ICD-10-CM

## 2016-01-06 LAB — OB RESULTS CONSOLE GC/CHLAMYDIA: Gonorrhea: NEGATIVE

## 2016-01-06 LAB — OB RESULTS CONSOLE GBS: STREP GROUP B AG: NEGATIVE

## 2016-01-06 NOTE — Progress Notes (Signed)
Subjective:  Lisa Vincent is a 25 y.o. G2P0010 at 6299w2d being seen today for ongoing prenatal care.  She is currently monitored for the following issues for this low-risk pregnancy and has Supervision of normal first pregnancy, antepartum; Migraine without aura and without status migrainosus, not intractable; Headache in pregnancy, antepartum; and Muscle spasm on her problem list.  Patient reports no complaints.  Contractions: Not present. Vag. Bleeding: None.  Movement: Present. Denies leaking of fluid.   The following portions of the patient's history were reviewed and updated as appropriate: allergies, current medications, past family history, past medical history, past social history, past surgical history and problem list. Problem list updated.  Objective:   Filed Vitals:   01/06/16 1354  BP: 111/78  Pulse: 106  Weight: 192 lb (87.091 kg)    Fetal Status: Fetal Heart Rate (bpm): 132   Movement: Present     General:  Alert, oriented and cooperative. Patient is in no acute distress.  Skin: Skin is warm and dry. No rash noted.   Cardiovascular: Normal heart rate noted  Respiratory: Normal respiratory effort, no problems with respiration noted  Abdomen: Soft, gravid, appropriate for gestational age. Pain/Pressure: Absent     Pelvic: Vag. Bleeding: None Vag D/C Character: Thin   Cervical exam performed        Extremities: Normal range of motion.  Edema: None  Mental Status: Normal mood and affect. Normal behavior. Normal judgment and thought content.   Urinalysis: Urine Protein: Negative Urine Glucose: Negative  Assessment and Plan:  Pregnancy: G2P0010 at 5299w2d  1. Supervision of normal first pregnancy, antepartum, third trimester - GC/Chlamydia probe amp ([place] - Culture, beta strep (group b only) - WET PREP FOR TRICH, YEAST, CLUE  2. Headache in pregnancy, antepartum, third trimester resolved  Preterm labor symptoms and general obstetric precautions  including but not limited to vaginal bleeding, contractions, leaking of fluid and fetal movement were reviewed in detail with the patient. Please refer to After Visit Summary for other counseling recommendations.  Return in about 2 weeks (around 01/20/2016).   Willodean Rosenthalarolyn Harraway-Smith, MD

## 2016-01-06 NOTE — Patient Instructions (Signed)
Levonorgestrel intrauterine device (IUD) What is this medicine? LEVONORGESTREL IUD (LEE voe nor jes trel) is a contraceptive (birth control) device. The device is placed inside the uterus by a healthcare professional. It is used to prevent pregnancy and can also be used to treat heavy bleeding that occurs during your period. Depending on the device, it can be used for 3 to 5 years. This medicine may be used for other purposes; ask your health care provider or pharmacist if you have questions. What should I tell my health care provider before I take this medicine? They need to know if you have any of these conditions: -abnormal Pap smear -cancer of the breast, uterus, or cervix -diabetes -endometritis -genital or pelvic infection now or in the past -have more than one sexual partner or your partner has more than one partner -heart disease -history of an ectopic or tubal pregnancy -immune system problems -IUD in place -liver disease or tumor -problems with blood clots or take blood-thinners -use intravenous drugs -uterus of unusual shape -vaginal bleeding that has not been explained -an unusual or allergic reaction to levonorgestrel, other hormones, silicone, or polyethylene, medicines, foods, dyes, or preservatives -pregnant or trying to get pregnant -breast-feeding How should I use this medicine? This device is placed inside the uterus by a health care professional. Talk to your pediatrician regarding the use of this medicine in children. Special care may be needed. Overdosage: If you think you have taken too much of this medicine contact a poison control center or emergency room at once. NOTE: This medicine is only for you. Do not share this medicine with others. What if I miss a dose? This does not apply. What may interact with this medicine? Do not take this medicine with any of the following medications: -amprenavir -bosentan -fosamprenavir This medicine may also interact with  the following medications: -aprepitant -barbiturate medicines for inducing sleep or treating seizures -bexarotene -griseofulvin -medicines to treat seizures like carbamazepine, ethotoin, felbamate, oxcarbazepine, phenytoin, topiramate -modafinil -pioglitazone -rifabutin -rifampin -rifapentine -some medicines to treat HIV infection like atazanavir, indinavir, lopinavir, nelfinavir, tipranavir, ritonavir -St. John's wort -warfarin This list may not describe all possible interactions. Give your health care provider a list of all the medicines, herbs, non-prescription drugs, or dietary supplements you use. Also tell them if you smoke, drink alcohol, or use illegal drugs. Some items may interact with your medicine. What should I watch for while using this medicine? Visit your doctor or health care professional for regular check ups. See your doctor if you or your partner has sexual contact with others, becomes HIV positive, or gets a sexual transmitted disease. This product does not protect you against HIV infection (AIDS) or other sexually transmitted diseases. You can check the placement of the IUD yourself by reaching up to the top of your vagina with clean fingers to feel the threads. Do not pull on the threads. It is a good habit to check placement after each menstrual period. Call your doctor right away if you feel more of the IUD than just the threads or if you cannot feel the threads at all. The IUD may come out by itself. You may become pregnant if the device comes out. If you notice that the IUD has come out use a backup birth control method like condoms and call your health care provider. Using tampons will not change the position of the IUD and are okay to use during your period. What side effects may I notice from receiving this medicine?   Side effects that you should report to your doctor or health care professional as soon as possible: -allergic reactions like skin rash, itching or  hives, swelling of the face, lips, or tongue -fever, flu-like symptoms -genital sores -high blood pressure -no menstrual period for 6 weeks during use -pain, swelling, warmth in the leg -pelvic pain or tenderness -severe or sudden headache -signs of pregnancy -stomach cramping -sudden shortness of breath -trouble with balance, talking, or walking -unusual vaginal bleeding, discharge -yellowing of the eyes or skin Side effects that usually do not require medical attention (report to your doctor or health care professional if they continue or are bothersome): -acne -breast pain -change in sex drive or performance -changes in weight -cramping, dizziness, or faintness while the device is being inserted -headache -irregular menstrual bleeding within first 3 to 6 months of use -nausea This list may not describe all possible side effects. Call your doctor for medical advice about side effects. You may report side effects to FDA at 1-800-FDA-1088. Where should I keep my medicine? This does not apply. NOTE: This sheet is a summary. It may not cover all possible information. If you have questions about this medicine, talk to your doctor, pharmacist, or health care provider.    2016, Elsevier/Gold Standard. (2011-11-18 13:54:04) Natural Childbirth Natural childbirth is going through labor and delivery without any drugs to relieve pain. You also do not use fetal monitors, have a cesarean delivery, or get a sugical cut to enlarge the vaginal opening (episiotomy). With the help of a birthing professional (midwife), you will direct your own labor and delivery as you choose. Many women chose natural childbirth because they feel more in control and in touch with their labor and delivery. They are also concerned about the medications affecting themselves and the baby. Pregnant women with a high risk pregnancy should not attempt natural childbirth. It is better to deliver the infant in a hospital if an  emergency situation arises. Sometimes, the caregiver has to intervene for the health and safety of the mother and infant. TWO TECHNIQUES FOR NATURAL CHILDBIRTH:   The Lamaze method. This method teaches women that having a baby is normal, healthy, and natural. It also teaches the mother to take a neutral position regarding pain medication and anesthesia and to make an informed decision if and when it is right for them.  The Bradley method (also called husband coached birth). This method teaches the father to be the birth coach and stresses a natural approach. It also encourages exercise and a balanced diet with good nutrition. The exercises teach relaxation and deep breathing techniques. However, there are also classes to prepare the parents for an emergency situation that may occur. METHODS OF DEALING WITH LABOR PAIN AND DELIVERY:  Meditation.  Yoga.  Hypnosis.  Acupuncture.  Massage.  Changing positions (walking, rocking, showering, leaning on birth balls).  Lying in warm water or a jacuzzi.  Find an activity that keeps your mind off of the labor pain.  Listen to soft music.  Visual imagery (focus on a particular object). BEFORE GOING INTO LABOR  Be sure you and your spouse/partner are in agreement to have natural childbirth.  Decide if your caregiver or a midwife will deliver your baby.  Decide if you will have your baby in the hospital, birthing center, or at home.  If you have children, make plans to have someone to take care of them when you go to the hospital.  Know the distance and the time it   takes to go to the delivery center. Make a dry run to be sure.  Have a bag packed with a night gown, bathrobe, and toiletries ready to take when you go into labor.  Keep phone numbers of your family and friends handy if you need to call someone when you go into labor.  Your spouse or partner should go to all the teaching classes.  Talk with your caregiver about the  possibility of a medical emergency and what will happen if that occurs. ADVANTAGES OF NATURAL CHILDBIRTH  You are in control of your labor and delivery.  It is safe.  There are no medications or anesthetics that may affect you and the fetus.  There are no invasive procedures such as an episiotomy.  You and your partner will work together, which can increase your bond.  Meditation, yoga, massage, and breathing exercises can be learned while pregnant and help you when you are in labor and at delivery.  In most delivery centers, the family and friends can be involved in the labor and delivery process. DISADVANTAGES OF NATURAL CHILDBIRTH  You will experience pain during your labor and delivery.  The methods of helping relieve your labor pains may not work for you.  You may feel embarrassed, disappointed, and like a failure if you decide to change your mind during labor and not have natural childbirth. AFTER THE DELIVERY  You will be very tired.  You will be uncomfortable because of your uterus contracting. You will feel soreness around the vagina.  You may feel cold and shaky.This is a natural reaction.  You will be excited, overwhelmed, accomplished, and proud to be a mother. HOME CARE INSTRUCTIONS   Follow the advice and instructions of your caregiver.  Follow the instructions of your natural childbirth instructor (Lamaze or Bradley Method).   This information is not intended to replace advice given to you by your health care provider. Make sure you discuss any questions you have with your health care provider.   Document Released: 09/30/2008 Document Revised: 01/10/2012 Document Reviewed: 06/25/2013 Elsevier Interactive Patient Education 2016 Elsevier Inc.  

## 2016-01-07 LAB — WET PREP FOR TRICH, YEAST, CLUE: Trich, Wet Prep: NONE SEEN

## 2016-01-07 LAB — GC/CHLAMYDIA PROBE AMP (~~LOC~~) NOT AT ARMC
Chlamydia: NEGATIVE
Neisseria Gonorrhea: NEGATIVE

## 2016-01-08 LAB — CULTURE, BETA STREP (GROUP B ONLY)

## 2016-01-12 ENCOUNTER — Telehealth: Payer: Self-pay | Admitting: *Deleted

## 2016-01-12 DIAGNOSIS — B379 Candidiasis, unspecified: Secondary | ICD-10-CM

## 2016-01-12 MED ORDER — TERCONAZOLE 0.8 % VA CREA: 1.0000 | TOPICAL_CREAM | Freq: Every day | VAGINAL | Status: DC

## 2016-01-12 NOTE — Telephone Encounter (Signed)
Pt was seen on Tuesday for OB f/u, had pelvic cultures and Dr Burnice LoganHarraway noticed some discharge that could be consistent with yeast, did wet prep.  Pt called requesting results, few yeast and clue cells were present on wet prep, pt not symptomatic.  Sent in [place] per Dr Shawnie PonsPratt order.  Notified pt of medication and use.

## 2016-01-15 ENCOUNTER — Telehealth: Payer: Self-pay | Admitting: *Deleted

## 2016-01-15 NOTE — Telephone Encounter (Signed)
-----   Message from Olevia BowensJacinda S Battle sent at 01/15/2016  9:12 AM EDT ----- Regarding: Rx Advise Contact: [phone] Had vaginal suppositories for a yeast infection, missing a dose because one fell out, needs to know if she needs a Rx for a repeat, asked patient and she states she is not currently having any symptoms of a yeast infection

## 2016-01-15 NOTE — Telephone Encounter (Signed)
Called pt she has no Sx and was able to take all but one dose. Adv pt if she starts have Sx to call back and we will recheck for yeast at next visit. Pt expressed understanding.

## 2016-01-20 ENCOUNTER — Encounter: Payer: BC Managed Care – PPO | Admitting: Obstetrics & Gynecology

## 2016-01-21 ENCOUNTER — Ambulatory Visit (INDEPENDENT_AMBULATORY_CARE_PROVIDER_SITE_OTHER): Payer: BC Managed Care – PPO | Admitting: Certified Nurse Midwife

## 2016-01-21 VITALS — BP 125/85 | HR 106 | Wt 193.0 lb

## 2016-01-21 DIAGNOSIS — Z3403 Encounter for supervision of normal first pregnancy, third trimester: Secondary | ICD-10-CM

## 2016-01-21 NOTE — Patient Instructions (Signed)

## 2016-01-21 NOTE — Progress Notes (Signed)
Subjective:  Lisa Vincent is a 25 y.o. G2P0010 at [redacted]w[redacted]d being seen today for ongoing prenatal care.  She is currently monitored for the following issues for this low-risk pregnancy and has Supervision of normal first pregnancy, antepartum; Migraine without aura and without status migrainosus, not intractable; Headache in pregnancy, antepartum; and Muscle spasm on her problem list.  Patient reports no complaints.  Contractions: Not present. Vag. Bleeding: None.  Movement: Present. Denies leaking of fluid.   The following portions of the patient's history were reviewed and updated as appropriate: allergies, current medications, past family history, past medical history, past social history, past surgical history and problem list. Problem list updated.  Objective:   Filed Vitals:   01/21/16 1416  BP: 125/85  Pulse: 106  Weight: 193 lb (87.544 kg)    Fetal Status: Fetal Heart Rate (bpm): 144   Movement: Present     General:  Alert, oriented and cooperative. Patient is in no acute distress.  Skin: Skin is warm and dry. No rash noted.   Cardiovascular: Normal heart rate noted  Respiratory: Normal respiratory effort, no problems with respiration noted  Abdomen: Soft, gravid, appropriate for gestational age. Pain/Pressure: Absent     Pelvic: Vag. Bleeding: None Vag D/C Character: Thin   Cervical exam performed        Extremities: Normal range of motion.  Edema: None  Mental Status: Normal mood and affect. Normal behavior. Normal judgment and thought content.   Urinalysis:      Assessment and Plan:  Pregnancy: G2P0010 at 506w3d  1. Supervision of normal first pregnancy, antepartum, third trimester   Term labor symptoms and general obstetric precautions including but not limited to vaginal bleeding, contractions, leaking of fluid and fetal movement were reviewed in detail with the patient. Please refer to After Visit Summary for other counseling recommendations.  Return in about 1 week  (around 01/28/2016).   Rhea PinkLori A Sanam Marmo, CNM

## 2016-01-22 ENCOUNTER — Inpatient Hospital Stay (HOSPITAL_COMMUNITY)
Admission: AD | Admit: 2016-01-22 | Discharge: 2016-01-23 | Disposition: A | Discharge status: Home / Self Care | Payer: BC Managed Care – PPO | Source: Ambulatory Visit | Attending: Family Medicine | Admitting: Family Medicine

## 2016-01-22 DIAGNOSIS — M25551 Pain in right hip: Secondary | ICD-10-CM

## 2016-01-22 NOTE — MAU Note (Signed)
Pt reports she has had right hip pain for several weeks worsened tonight, denies bleeding.

## 2016-01-23 ENCOUNTER — Telehealth: Payer: Self-pay | Admitting: *Deleted

## 2016-01-23 DIAGNOSIS — M25551 Pain in right hip: Secondary | ICD-10-CM | POA: Diagnosis not present

## 2016-01-23 DIAGNOSIS — O9989 Other specified diseases and conditions complicating pregnancy, childbirth and the puerperium: Secondary | ICD-10-CM | POA: Diagnosis not present

## 2016-01-23 DIAGNOSIS — M549 Dorsalgia, unspecified: Secondary | ICD-10-CM

## 2016-01-23 DIAGNOSIS — Z3A38 38 weeks gestation of pregnancy: Secondary | ICD-10-CM

## 2016-01-23 MED ORDER — LIDOCAINE 5 % EX PTCH: 1.0000 | MEDICATED_PATCH | CUTANEOUS | Status: DC

## 2016-01-23 MED ORDER — CYCLOBENZAPRINE HCL 10 MG PO TABS: 10.0000 mg | ORAL_TABLET | Freq: Three times a day (TID) | ORAL | Status: DC | PRN

## 2016-01-23 NOTE — Telephone Encounter (Signed)
-----   Message from Olevia BowensJacinda S Battle sent at 01/23/2016  8:45 AM EDT ----- Regarding: Refill Request Contact: [phone] Wants a refill on Flexeril

## 2016-01-23 NOTE — Telephone Encounter (Signed)
Sent new rx for Flexeril to pharmacy per Dr Macon LargeAnyanwu order.

## 2016-01-23 NOTE — MAU Provider Note (Signed)
MAU HISTORY AND PHYSICAL  Chief Complaint:  Right Hip Pain  Lisa Vincent is a 25 y.o.  G2P0010 with IUP at 919w5d presenting for Right hip pain. Patient states she has been having sharpe R hip pain that started at 2000.  This pain radiates to R lower back.  Pain lasting for 40 seconds.  Patient has had difficulty walking.  Denies Fever, autoimmune arthritis, N/V/C, dysuria, HA, or BV.  Patient has had BM today.   Patient endorses FM+.  Denies ROM- clear fluid leak and bleeding.    Past Surgical History  Procedure Laterality Date  . Hernia repair      Family History  Problem Relation Age of Onset  . Hypertension Mother   . Hypertension Maternal Aunt   . Hypertension Maternal Uncle   . Hypertension Maternal Grandmother     Social History  Substance Use Topics  . Smoking status: Never Smoker   . Smokeless tobacco: Never Used  . Alcohol Use: No    No Known Allergies  Prescriptions prior to admission  Medication Sig Dispense Refill Last Dose  . Prenatal Vit-Fe Fumarate-FA (PRENATAL VITAMIN PO) Take by mouth.   01/22/2016 at Unknown time  . cyclobenzaprine (FLEXERIL) 10 MG tablet Take 1 tablet (10 mg total) by mouth every 8 (eight) hours as needed for muscle spasms. (Patient not taking: Reported on 09/16/2015) 30 tablet 1 Not Taking    Review of Systems - Negative except for what is mentioned in HPI.  Physical Exam  Last menstrual period 04/27/2015. GENERAL: Well-developed, well-nourished female in no acute distress.  LUNGS: Clear to auscultation bilaterally.  HEART: Regular rate and rhythm. ABDOMEN: Soft, nontender, nondistended, gravid.   GU: no CVA tenderness MSK: No R hip swelling, warmth, or erythema.  Nml passive ROM.   EXTREMITIES: Nontender, no edema, 2+ distal pulses. Cervical Exam: 1/thick Presentation: vertex FHT:  Category 1 tracing Contractions: Intermittent   Labs: No results found for this or any previous visit (from the past 24 hour(s)).  Imaging Studies:   No results found.  Assessment: Lisa Vincent is  25 y.o. G2P0010 at 239w5d presents with R hip pain 2/2 pains of pregnancy. Plan: -For hip pain, will prescribe lidocaine patch, ice, flexeril.  -Continue routine prenatal care -Follow-up at [facility] with next appointment.   I saw and examined the pt and agree with the above. CRESENZO-DISHMAN,Mackinley Kiehn   Michael J [place] 3/24/201712:15 AM

## 2016-01-28 ENCOUNTER — Ambulatory Visit (INDEPENDENT_AMBULATORY_CARE_PROVIDER_SITE_OTHER): Payer: BC Managed Care – PPO | Admitting: Certified Nurse Midwife

## 2016-01-28 VITALS — BP 109/71 | HR 99 | Wt 195.0 lb

## 2016-01-28 DIAGNOSIS — Z3403 Encounter for supervision of normal first pregnancy, third trimester: Secondary | ICD-10-CM

## 2016-01-28 NOTE — Progress Notes (Signed)
Subjective:  Lisa Vincent is a 25 y.o. G2P0010 at 6637w3d being seen today for ongoing prenatal care.  She is currently monitored for the following issues for this low-risk pregnancy and has Supervision of normal first pregnancy, antepartum; Migraine without aura and without status migrainosus, not intractable; Headache in pregnancy, antepartum; and Muscle spasm on her problem list.  Patient reports hip discomfort.  Contractions: Not present. Vag. Bleeding: None.  Movement: Present. Denies leaking of fluid.   The following portions of the patient's history were reviewed and updated as appropriate: allergies, current medications, past family history, past medical history, past social history, past surgical history and problem list. Problem list updated.  Objective:   Filed Vitals:   01/28/16 1429  BP: 109/71  Pulse: 99  Weight: 195 lb (88.451 kg)    Fetal Status: Fetal Heart Rate (bpm): 141   Movement: Present     General:  Alert, oriented and cooperative. Patient is in no acute distress.  Skin: Skin is warm and dry. No rash noted.   Cardiovascular: Normal heart rate noted  Respiratory: Normal respiratory effort, no problems with respiration noted  Abdomen: Soft, gravid, appropriate for gestational age. Pain/Pressure: Absent     Pelvic: Vag. Bleeding: None Vag D/C Character: Thin   Cervical exam performed        Extremities: Normal range of motion.  Edema: None  Mental Status: Normal mood and affect. Normal behavior. Normal judgment and thought content.   Urinalysis: Urine Protein: Negative Urine Glucose: Negative  Assessment and Plan:  Pregnancy: G2P0010 at 4037w3d  1. Supervision of normal first pregnancy, antepartum, third trimester   Term labor symptoms and general obstetric precautions including but not limited to vaginal bleeding, contractions, leaking of fluid and fetal movement were reviewed in detail with the patient. Please refer to After Visit Summary for other counseling  recommendations.  Return in about 1 week (around 02/04/2016) for schedule nst.   Rhea PinkLori A Clemmons, CNM

## 2016-01-28 NOTE — Patient Instructions (Signed)

## 2016-02-04 ENCOUNTER — Ambulatory Visit (INDEPENDENT_AMBULATORY_CARE_PROVIDER_SITE_OTHER): Payer: BC Managed Care – PPO | Admitting: Certified Nurse Midwife

## 2016-02-04 VITALS — BP 112/80 | HR 99 | Wt 197.0 lb

## 2016-02-04 DIAGNOSIS — Z3403 Encounter for supervision of normal first pregnancy, third trimester: Secondary | ICD-10-CM

## 2016-02-04 DIAGNOSIS — O480 Post-term pregnancy: Secondary | ICD-10-CM | POA: Diagnosis not present

## 2016-02-04 NOTE — Progress Notes (Signed)
Subjective:  Lisa Vincent is a 25 y.o. G2P0010 at 3722w3d being seen today for ongoing prenatal care.  She is currently monitored for the following issues for this low-risk pregnancy and has Supervision of normal first pregnancy, antepartum; Migraine without aura and without status migrainosus, not intractable; Headache in pregnancy, antepartum; and Muscle spasm on her problem list.  Patient reports no complaints.  Contractions: Not present. Vag. Bleeding: None.  Movement: Present. Denies leaking of fluid.   The following portions of the patient's history were reviewed and updated as appropriate: allergies, current medications, past family history, past medical history, past social history, past surgical history and problem list. Problem list updated.  Objective:   Filed Vitals:   02/04/16 1417  BP: 112/80  Pulse: 99  Weight: 197 lb (89.359 kg)    Fetal Status: Fetal Heart Rate (bpm): NST   Movement: Present     General:  Alert, oriented and cooperative. Patient is in no acute distress.  Skin: Skin is warm and dry. No rash noted.   Cardiovascular: Normal heart rate noted  Respiratory: Normal respiratory effort, no problems with respiration noted  Abdomen: Soft, gravid, appropriate for gestational age. Pain/Pressure: Present     Pelvic: Vag. Bleeding: None Vag D/C Character: Thin   Cervical exam deferred        Extremities: Normal range of motion.  Edema: None  Mental Status: Normal mood and affect. Normal behavior. Normal judgment and thought content.   Urinalysis: Urine Protein: Negative Urine Glucose: Negative  Assessment and Plan:  Pregnancy: G2P0010 at 6322w3d  1. Supervision of normal first pregnancy, antepartum, third trimester   Term labor symptoms and general obstetric precautions including but not limited to vaginal bleeding, contractions, leaking of fluid and fetal movement were reviewed in detail with the patient. Please refer to After Visit Summary for other counseling  recommendations.  No Follow-up on file.  Induction scheduled for April 9 Sunday night; decliones cervical exam; NST reactive  Rhea PinkLori A Zaine Elsass, CNM

## 2016-02-05 ENCOUNTER — Encounter: Payer: Self-pay | Admitting: *Deleted

## 2016-02-05 ENCOUNTER — Encounter (HOSPITAL_COMMUNITY): Payer: Self-pay | Admitting: *Deleted

## 2016-02-05 ENCOUNTER — Telehealth (HOSPITAL_COMMUNITY): Payer: Self-pay | Admitting: *Deleted

## 2016-02-05 NOTE — Telephone Encounter (Signed)
Preadmission screen  

## 2016-02-06 ENCOUNTER — Inpatient Hospital Stay (HOSPITAL_COMMUNITY): Payer: Medicaid Other | Admitting: Anesthesiology

## 2016-02-06 ENCOUNTER — Encounter (HOSPITAL_COMMUNITY)
Admission: AD | Disposition: A | Discharge status: Home / Self Care | Payer: Self-pay | Source: Ambulatory Visit | Attending: Obstetrics & Gynecology

## 2016-02-06 ENCOUNTER — Encounter (HOSPITAL_COMMUNITY): Payer: Self-pay

## 2016-02-06 ENCOUNTER — Inpatient Hospital Stay (HOSPITAL_COMMUNITY): Payer: BC Managed Care – PPO

## 2016-02-06 ENCOUNTER — Inpatient Hospital Stay (HOSPITAL_COMMUNITY)
Admission: AD | Admit: 2016-02-06 | Discharge: 2016-02-08 | DRG: 766 | Disposition: A | Discharge status: Home / Self Care | Payer: Medicaid Other | Source: Ambulatory Visit | Attending: Obstetrics & Gynecology | Admitting: Obstetrics & Gynecology

## 2016-02-06 ENCOUNTER — Encounter (HOSPITAL_COMMUNITY): Payer: Self-pay | Admitting: *Deleted

## 2016-02-06 ENCOUNTER — Inpatient Hospital Stay (HOSPITAL_COMMUNITY)
Admission: AD | Admit: 2016-02-06 | Discharge: 2016-02-06 | Disposition: A | Discharge status: Home / Self Care | Payer: BC Managed Care – PPO | Source: Ambulatory Visit | Attending: Obstetrics & Gynecology | Admitting: Obstetrics & Gynecology

## 2016-02-06 DIAGNOSIS — Z8249 Family history of ischemic heart disease and other diseases of the circulatory system: Secondary | ICD-10-CM

## 2016-02-06 DIAGNOSIS — Z3A40 40 weeks gestation of pregnancy: Secondary | ICD-10-CM | POA: Diagnosis not present

## 2016-02-06 DIAGNOSIS — O26893 Other specified pregnancy related conditions, third trimester: Secondary | ICD-10-CM | POA: Diagnosis present

## 2016-02-06 DIAGNOSIS — O368130 Decreased fetal movements, third trimester, not applicable or unspecified: Secondary | ICD-10-CM | POA: Diagnosis present

## 2016-02-06 DIAGNOSIS — O289 Unspecified abnormal findings on antenatal screening of mother: Secondary | ICD-10-CM | POA: Diagnosis not present

## 2016-02-06 DIAGNOSIS — Z98891 History of uterine scar from previous surgery: Secondary | ICD-10-CM

## 2016-02-06 DIAGNOSIS — Z3403 Encounter for supervision of normal first pregnancy, third trimester: Secondary | ICD-10-CM

## 2016-02-06 DIAGNOSIS — O288 Other abnormal findings on antenatal screening of mother: Secondary | ICD-10-CM

## 2016-02-06 DIAGNOSIS — IMO0001 Reserved for inherently not codable concepts without codable children: Secondary | ICD-10-CM

## 2016-02-06 LAB — COMPREHENSIVE METABOLIC PANEL
ALT: 21 U/L (ref 14–54)
ANION GAP: 8 (ref 5–15)
AST: 22 U/L (ref 15–41)
Albumin: 3.3 g/dL — ABNORMAL LOW (ref 3.5–5.0)
Alkaline Phosphatase: 143 U/L — ABNORMAL HIGH (ref 38–126)
BUN: 8 mg/dL (ref 6–20)
CO2: 20 mmol/L — ABNORMAL LOW (ref 22–32)
Calcium: 8.7 mg/dL — ABNORMAL LOW (ref 8.9–10.3)
Chloride: 108 mmol/L (ref 101–111)
Creatinine, Ser: 0.53 mg/dL (ref 0.44–1.00)
Glucose, Bld: 99 mg/dL (ref 65–99)
POTASSIUM: 4 mmol/L (ref 3.5–5.1)
Sodium: 136 mmol/L (ref 135–145)
TOTAL PROTEIN: 6.8 g/dL (ref 6.5–8.1)
Total Bilirubin: 0.9 mg/dL (ref 0.3–1.2)

## 2016-02-06 LAB — CBC
HEMATOCRIT: 35.7 % — AB (ref 36.0–46.0)
HEMOGLOBIN: 12.5 g/dL (ref 12.0–15.0)
MCH: 33.4 pg (ref 26.0–34.0)
MCHC: 35 g/dL (ref 30.0–36.0)
MCV: 95.5 fL (ref 78.0–100.0)
Platelets: 121 10*3/uL — ABNORMAL LOW (ref 150–400)
RBC: 3.74 MIL/uL — AB (ref 3.87–5.11)
RDW: 13.6 % (ref 11.5–15.5)
WBC: 10.7 10*3/uL — AB (ref 4.0–10.5)

## 2016-02-06 LAB — TYPE AND SCREEN
ABO/RH(D): A POS
ANTIBODY SCREEN: NEGATIVE

## 2016-02-06 LAB — HEPATITIS B SURFACE ANTIGEN: HEP B S AG: NEGATIVE

## 2016-02-06 LAB — ABO/RH: ABO/RH(D): A POS

## 2016-02-06 SURGERY — Surgical Case
Anesthesia: Epidural | Site: Abdomen

## 2016-02-06 MED ORDER — SCOPOLAMINE 1 MG/3DAYS TD PT72
1.0000 | MEDICATED_PATCH | Freq: Once | TRANSDERMAL | Status: DC
  Filled 2016-02-06: qty 1

## 2016-02-06 MED ORDER — DIPHENHYDRAMINE HCL 25 MG PO CAPS: 25.0000 mg | ORAL_CAPSULE | ORAL | Status: DC | PRN

## 2016-02-06 MED ORDER — ESMOLOL HCL 100 MG/10ML IV SOLN
INTRAVENOUS | Status: DC | PRN
  Administered 2016-02-06 (×2): 10 mg via INTRAVENOUS

## 2016-02-06 MED ORDER — EPHEDRINE 5 MG/ML INJ: 10.0000 mg | INTRAVENOUS | Status: DC | PRN

## 2016-02-06 MED ORDER — NALBUPHINE HCL 10 MG/ML IJ SOLN: 5.0000 mg | Freq: Once | INTRAMUSCULAR | Status: DC | PRN

## 2016-02-06 MED ORDER — KETOROLAC TROMETHAMINE 30 MG/ML IJ SOLN: 30.0000 mg | Freq: Four times a day (QID) | INTRAMUSCULAR | Status: AC | PRN

## 2016-02-06 MED ORDER — MEPERIDINE HCL 25 MG/ML IJ SOLN: 6.2500 mg | INTRAMUSCULAR | Status: DC | PRN

## 2016-02-06 MED ORDER — LACTATED RINGERS IV SOLN: 500.0000 mL | Freq: Once | INTRAVENOUS | Status: DC

## 2016-02-06 MED ORDER — CEFAZOLIN SODIUM-DEXTROSE 2-3 GM-% IV SOLR
INTRAVENOUS | Status: DC | PRN
  Administered 2016-02-06: 2 g via INTRAVENOUS

## 2016-02-06 MED ORDER — NALOXONE HCL 0.4 MG/ML IJ SOLN: 0.4000 mg | INTRAMUSCULAR | Status: DC | PRN

## 2016-02-06 MED ORDER — LACTATED RINGERS IV SOLN
INTRAVENOUS | Status: DC
  Administered 2016-02-06 (×3): via INTRAVENOUS

## 2016-02-06 MED ORDER — ONDANSETRON HCL 4 MG/2ML IJ SOLN
4.0000 mg | Freq: Four times a day (QID) | INTRAMUSCULAR | Status: DC | PRN
  Administered 2016-02-06: 4 mg via INTRAVENOUS

## 2016-02-06 MED ORDER — NALBUPHINE HCL 10 MG/ML IJ SOLN: 5.0000 mg | INTRAMUSCULAR | Status: DC | PRN

## 2016-02-06 MED ORDER — OXYTOCIN 10 UNIT/ML IJ SOLN
1.0000 m[IU]/min | INTRAVENOUS | Status: DC
  Administered 2016-02-06: 2 m[IU]/min via INTRAVENOUS

## 2016-02-06 MED ORDER — ACETAMINOPHEN 325 MG PO TABS: 650.0000 mg | ORAL_TABLET | ORAL | Status: DC | PRN

## 2016-02-06 MED ORDER — TERBUTALINE SULFATE 1 MG/ML IJ SOLN
INTRAMUSCULAR | Status: AC
  Administered 2016-02-06: 0.25 mg via SUBCUTANEOUS
  Filled 2016-02-06: qty 1

## 2016-02-06 MED ORDER — MORPHINE SULFATE (PF) 0.5 MG/ML IJ SOLN
INTRAMUSCULAR | Status: AC
  Filled 2016-02-06: qty 10

## 2016-02-06 MED ORDER — ONDANSETRON HCL 4 MG/2ML IJ SOLN
INTRAMUSCULAR | Status: AC
  Filled 2016-02-06: qty 2

## 2016-02-06 MED ORDER — LIDOCAINE HCL (PF) 1 % IJ SOLN
INTRAMUSCULAR | Status: DC | PRN
  Administered 2016-02-06: 2 mL
  Administered 2016-02-06: 5 mL
  Administered 2016-02-06: 3 mL
  Administered 2016-02-06: 2 mL
  Administered 2016-02-06: 5 mL

## 2016-02-06 MED ORDER — OXYTOCIN 10 UNIT/ML IJ SOLN
INTRAMUSCULAR | Status: AC
  Filled 2016-02-06: qty 4

## 2016-02-06 MED ORDER — MEPERIDINE HCL 25 MG/ML IJ SOLN
INTRAMUSCULAR | Status: AC
  Filled 2016-02-06: qty 1

## 2016-02-06 MED ORDER — SODIUM CHLORIDE 0.9 % IR SOLN
Status: DC | PRN
  Administered 2016-02-06: 1000 mL

## 2016-02-06 MED ORDER — HYDROMORPHONE HCL 1 MG/ML IJ SOLN: 0.2500 mg | INTRAMUSCULAR | Status: DC | PRN

## 2016-02-06 MED ORDER — TERBUTALINE SULFATE 1 MG/ML IJ SOLN
0.2500 mg | Freq: Once | INTRAMUSCULAR | Status: AC | PRN
Start: 2016-02-06 — End: 2016-02-06
  Administered 2016-02-06: 0.25 mg via SUBCUTANEOUS

## 2016-02-06 MED ORDER — SODIUM CHLORIDE 0.9% FLUSH: 3.0000 mL | INTRAVENOUS | Status: DC | PRN

## 2016-02-06 MED ORDER — PHENYLEPHRINE HCL 10 MG/ML IJ SOLN
INTRAMUSCULAR | Status: DC | PRN
  Administered 2016-02-06 (×3): 40 ug via INTRAVENOUS
  Administered 2016-02-06: 80 ug via INTRAVENOUS

## 2016-02-06 MED ORDER — SCOPOLAMINE 1 MG/3DAYS TD PT72
MEDICATED_PATCH | TRANSDERMAL | Status: AC
  Filled 2016-02-06: qty 1

## 2016-02-06 MED ORDER — TERBUTALINE SULFATE 1 MG/ML IJ SOLN
INTRAMUSCULAR | Status: AC
  Filled 2016-02-06: qty 1

## 2016-02-06 MED ORDER — OXYCODONE-ACETAMINOPHEN 5-325 MG PO TABS: 1.0000 | ORAL_TABLET | ORAL | Status: DC | PRN

## 2016-02-06 MED ORDER — LACTATED RINGERS IV SOLN
INTRAVENOUS | Status: DC | PRN
  Administered 2016-02-06: 22:00:00 via INTRAVENOUS

## 2016-02-06 MED ORDER — FENTANYL 2.5 MCG/ML BUPIVACAINE 1/10 % EPIDURAL INFUSION (WH - ANES)
14.0000 mL/h | INTRAMUSCULAR | Status: DC | PRN
Start: 2016-02-06 — End: 2016-02-07
  Administered 2016-02-06 (×2): 14 mL/h via EPIDURAL
  Filled 2016-02-06: qty 125

## 2016-02-06 MED ORDER — PHENYLEPHRINE 40 MCG/ML (10ML) SYRINGE FOR IV PUSH (FOR BLOOD PRESSURE SUPPORT)
80.0000 ug | PREFILLED_SYRINGE | INTRAVENOUS | Status: DC | PRN
  Filled 2016-02-06: qty 20

## 2016-02-06 MED ORDER — NALOXONE HCL 2 MG/2ML IJ SOSY
1.0000 ug/kg/h | PREFILLED_SYRINGE | INTRAVENOUS | Status: DC | PRN
  Filled 2016-02-06: qty 2

## 2016-02-06 MED ORDER — LIDOCAINE-EPINEPHRINE 2 %-1:100000 IJ SOLN: INTRAMUSCULAR | Status: DC | PRN

## 2016-02-06 MED ORDER — DEXAMETHASONE SODIUM PHOSPHATE 4 MG/ML IJ SOLN
INTRAMUSCULAR | Status: DC | PRN
  Administered 2016-02-06: 4 mg via INTRAVENOUS

## 2016-02-06 MED ORDER — PHENYLEPHRINE 40 MCG/ML (10ML) SYRINGE FOR IV PUSH (FOR BLOOD PRESSURE SUPPORT): 80.0000 ug | PREFILLED_SYRINGE | INTRAVENOUS | Status: DC | PRN

## 2016-02-06 MED ORDER — METHYLERGONOVINE MALEATE 0.2 MG/ML IJ SOLN
INTRAMUSCULAR | Status: DC | PRN
  Administered 2016-02-06: 0.2 mg via INTRAMUSCULAR

## 2016-02-06 MED ORDER — ONDANSETRON HCL 4 MG/2ML IJ SOLN: 4.0000 mg | Freq: Three times a day (TID) | INTRAMUSCULAR | Status: DC | PRN

## 2016-02-06 MED ORDER — CITRIC ACID-SODIUM CITRATE 334-500 MG/5ML PO SOLN
30.0000 mL | ORAL | Status: DC | PRN
  Administered 2016-02-06: 30 mL via ORAL
  Filled 2016-02-06: qty 15

## 2016-02-06 MED ORDER — DIPHENHYDRAMINE HCL 50 MG/ML IJ SOLN: 12.5000 mg | INTRAMUSCULAR | Status: DC | PRN

## 2016-02-06 MED ORDER — LIDOCAINE HCL (PF) 1 % IJ SOLN: 30.0000 mL | INTRAMUSCULAR | Status: DC | PRN

## 2016-02-06 MED ORDER — DEXAMETHASONE SODIUM PHOSPHATE 4 MG/ML IJ SOLN
INTRAMUSCULAR | Status: AC
  Filled 2016-02-06: qty 1

## 2016-02-06 MED ORDER — MORPHINE SULFATE (PF) 0.5 MG/ML IJ SOLN
INTRAMUSCULAR | Status: DC | PRN
  Administered 2016-02-06: 3 mg via EPIDURAL

## 2016-02-06 MED ORDER — OXYTOCIN BOLUS FROM INFUSION: 500.0000 mL | INTRAVENOUS | Status: DC

## 2016-02-06 MED ORDER — OXYTOCIN 10 UNIT/ML IJ SOLN
40.0000 [IU] | INTRAVENOUS | Status: DC | PRN
  Administered 2016-02-06: 40 [IU] via INTRAVENOUS

## 2016-02-06 MED ORDER — OXYCODONE-ACETAMINOPHEN 5-325 MG PO TABS: 2.0000 | ORAL_TABLET | ORAL | Status: DC | PRN

## 2016-02-06 MED ORDER — OXYTOCIN 10 UNIT/ML IJ SOLN
2.5000 [IU]/h | INTRAVENOUS | Status: DC
  Filled 2016-02-06: qty 4

## 2016-02-06 MED ORDER — SODIUM BICARBONATE 8.4 % IV SOLN
INTRAVENOUS | Status: DC | PRN
  Administered 2016-02-06: 21:00:00 via EPIDURAL
  Administered 2016-02-06 (×2): 5 mL via EPIDURAL

## 2016-02-06 MED ORDER — ACETAMINOPHEN 10 MG/ML IV SOLN
1000.0000 mg | Freq: Once | INTRAVENOUS | Status: AC
  Administered 2016-02-06: 1000 mg via INTRAVENOUS
  Filled 2016-02-06: qty 100

## 2016-02-06 MED ORDER — LACTATED RINGERS IV SOLN
500.0000 mL | INTRAVENOUS | Status: DC | PRN
  Administered 2016-02-06: 1000 mL via INTRAVENOUS

## 2016-02-06 SURGICAL SUPPLY — 36 items
CHLORAPREP W/TINT 26ML (MISCELLANEOUS) ×6 IMPLANT
CLAMP CORD UMBIL (MISCELLANEOUS) IMPLANT
CLOTH BEACON ORANGE TIMEOUT ST (SAFETY) ×3 IMPLANT
DRSG OPSITE POSTOP 4X10 (GAUZE/BANDAGES/DRESSINGS) ×3 IMPLANT
ELECT REM PT RETURN 9FT ADLT (ELECTROSURGICAL) ×3
ELECTRODE REM PT RTRN 9FT ADLT (ELECTROSURGICAL) ×1 IMPLANT
EXTRACTOR VACUUM BELL STYLE (SUCTIONS) IMPLANT
GLOVE BIOGEL PI IND STRL 7.0 (GLOVE) ×4 IMPLANT
GLOVE BIOGEL PI IND STRL 8 (GLOVE) ×1 IMPLANT
GLOVE BIOGEL PI INDICATOR 7.0 (GLOVE) ×8
GLOVE BIOGEL PI INDICATOR 8 (GLOVE) ×2
GLOVE ECLIPSE 8.0 STRL XLNG CF (GLOVE) ×3 IMPLANT
GLOVE SURG SS PI 7.0 STRL IVOR (GLOVE) ×3 IMPLANT
GOWN STRL REUS W/TWL LRG LVL3 (GOWN DISPOSABLE) ×6 IMPLANT
KIT ABG SYR 3ML LUER SLIP (SYRINGE) ×3 IMPLANT
LIQUID BAND (GAUZE/BANDAGES/DRESSINGS) ×3 IMPLANT
NEEDLE HYPO 18GX1.5 BLUNT FILL (NEEDLE) ×3 IMPLANT
NEEDLE HYPO 22GX1.5 SAFETY (NEEDLE) ×3 IMPLANT
NEEDLE HYPO 25X5/8 SAFETYGLIDE (NEEDLE) ×3 IMPLANT
NS IRRIG 1000ML POUR BTL (IV SOLUTION) ×3 IMPLANT
PACK C SECTION WH (CUSTOM PROCEDURE TRAY) ×3 IMPLANT
PAD OB MATERNITY 4.3X12.25 (PERSONAL CARE ITEMS) ×3 IMPLANT
PENCIL SMOKE EVAC W/HOLSTER (ELECTROSURGICAL) ×3 IMPLANT
RTRCTR C-SECT PINK 25CM LRG (MISCELLANEOUS) IMPLANT
SUT CHROMIC 0 CT 1 (SUTURE) ×3 IMPLANT
SUT MNCRL 0 VIOLET CTX 36 (SUTURE) ×2 IMPLANT
SUT MONOCRYL 0 CTX 36 (SUTURE) ×4
SUT PLAIN 2 0 (SUTURE)
SUT PLAIN 2 0 XLH (SUTURE) IMPLANT
SUT PLAIN ABS 2-0 CT1 27XMFL (SUTURE) IMPLANT
SUT VIC AB 0 CTX 36 (SUTURE) ×2
SUT VIC AB 0 CTX36XBRD ANBCTRL (SUTURE) ×1 IMPLANT
SUT VIC AB 4-0 KS 27 (SUTURE) ×3 IMPLANT
SYR 20CC LL (SYRINGE) ×6 IMPLANT
TOWEL OR 17X24 6PK STRL BLUE (TOWEL DISPOSABLE) ×3 IMPLANT
TRAY FOLEY CATH SILVER 14FR (SET/KITS/TRAYS/PACK) IMPLANT

## 2016-02-06 NOTE — Anesthesia Postprocedure Evaluation (Signed)
Anesthesia Post Note  Patient: Lisa Vincent  Procedure(s) Performed: Procedure(s) (LRB): CESAREAN SECTION (N/A)  Patient location during evaluation: PACU Anesthesia Type: MAC and Epidural Level of consciousness: awake and alert Pain management: pain level controlled Vital Signs Assessment: post-procedure vital signs reviewed and stable Respiratory status: spontaneous breathing Cardiovascular status: stable Postop Assessment: epidural receding Anesthetic complications: no    Last Vitals:  Filed Vitals:   02/06/16 2245 02/06/16 2300  BP: 111/66   Pulse: 89   Temp:  37.7 C  Resp: 12     Last Pain:  Filed Vitals:   02/06/16 2306  PainSc: 0-No pain    LLE Motor Response: Purposeful movement (02/06/16 2300) LLE Sensation: Numbness;Tingling (02/06/16 2300) RLE Motor Response: Purposeful movement (02/06/16 2300) RLE Sensation: Numbness;Tingling (02/06/16 2300)      Lewie LoronJohn Allysha Tryon

## 2016-02-06 NOTE — Progress Notes (Signed)
Notified of pt arrival in MAU, complaint and vaginal exam. Also reviewed non reactive  FHR tracing with CNM. Will monitor for 15 more minutes and if not reactive, will send for BPP

## 2016-02-06 NOTE — Anesthesia Preprocedure Evaluation (Addendum)
Anesthesia Evaluation  Patient identified by MRN, date of birth, ID band Patient awake    Reviewed: Allergy & Precautions, NPO status , Patient's Chart, lab work & pertinent test results  Airway Mallampati: III       Dental   Pulmonary neg pulmonary ROS,    Pulmonary exam normal        Cardiovascular negative cardio ROS Normal cardiovascular exam     Neuro/Psych  Headaches,    GI/Hepatic negative GI ROS, Neg liver ROS,   Endo/Other  negative endocrine ROS  Renal/GU negative Renal ROS     Musculoskeletal   Abdominal   Peds  Hematology Thrombocytopenia   Anesthesia Other Findings   Reproductive/Obstetrics (+) Pregnancy                             Lab Results  Component Value Date   WBC 10.7* 02/06/2016   HGB 12.5 02/06/2016   HCT 35.7* 02/06/2016   MCV 95.5 02/06/2016   PLT 121* 02/06/2016   No results found for: CREATININE, BUN, NA, K, CL, CO2  Anesthesia Physical Anesthesia Plan  ASA: II  Anesthesia Plan: Epidural   Post-op Pain Management:    Induction:   Airway Management Planned: Natural Airway  Additional Equipment:   Intra-op Plan:   Post-operative Plan:   Informed Consent: I have reviewed the patients History and Physical, chart, labs and discussed the procedure including the risks, benefits and alternatives for the proposed anesthesia with the patient or authorized representative who has indicated his/her understanding and acceptance.     Plan Discussed with:   Anesthesia Plan Comments:         Anesthesia Quick Evaluation

## 2016-02-06 NOTE — Discharge Instructions (Signed)
Third Trimester of Pregnancy °The third trimester is from week 29 through week 42, months 7 through 9. The third trimester is a time when the fetus is growing rapidly. At the end of the ninth month, the fetus is about 20 inches in length and weighs 6-10 pounds.  °BODY CHANGES °Your body goes through many changes during pregnancy. The changes vary from woman to woman.  °· Your weight will continue to increase. You can expect to gain 25-35 pounds (11-16 kg) by the end of the pregnancy. °· You may begin to get stretch marks on your hips, abdomen, and breasts. °· You may urinate more often because the fetus is moving lower into your pelvis and pressing on your bladder. °· You may develop or continue to have heartburn as a result of your pregnancy. °· You may develop constipation because certain hormones are causing the muscles that push waste through your intestines to slow down. °· You may develop hemorrhoids or swollen, bulging veins (varicose veins). °· You may have pelvic pain because of the weight gain and pregnancy hormones relaxing your joints between the bones in your pelvis. Backaches may result from overexertion of the muscles supporting your posture. °· You may have changes in your hair. These can include thickening of your hair, rapid growth, and changes in texture. Some women also have hair loss during or after pregnancy, or hair that feels dry or thin. Your hair will most likely return to normal after your baby is born. °· Your breasts will continue to grow and be tender. A yellow discharge may leak from your breasts called colostrum. °· Your belly button may stick out. °· You may feel short of breath because of your expanding uterus. °· You may notice the fetus "dropping," or moving lower in your abdomen. °· You may have a bloody mucus discharge. This usually occurs a few days to a week before labor begins. °· Your cervix becomes thin and soft (effaced) near your due date. °WHAT TO EXPECT AT YOUR PRENATAL  EXAMS  °You will have prenatal exams every 2 weeks until week 36. Then, you will have weekly prenatal exams. During a routine prenatal visit: °· You will be weighed to make sure you and the fetus are growing normally. °· Your blood pressure is taken. °· Your abdomen will be measured to track your baby's growth. °· The fetal heartbeat will be listened to. °· Any test results from the previous visit will be discussed. °· You may have a cervical check near your due date to see if you have effaced. °At around 36 weeks, your caregiver will check your cervix. At the same time, your caregiver will also perform a test on the secretions of the vaginal tissue. This test is to determine if a type of bacteria, Group B streptococcus, is present. Your caregiver will explain this further. °Your caregiver may ask you: °· What your birth plan is. °· How you are feeling. °· If you are feeling the baby move. °· If you have had any abnormal symptoms, such as leaking fluid, bleeding, severe headaches, or abdominal cramping. °· If you are using any tobacco products, including cigarettes, chewing tobacco, and electronic cigarettes. °· If you have any questions. °Other tests or screenings that may be performed during your third trimester include: °· Blood tests that check for low iron levels (anemia). °· Fetal testing to check the health, activity level, and growth of the fetus. Testing is done if you have certain medical conditions or if   there are problems during the pregnancy. °· HIV (human immunodeficiency virus) testing. If you are at high risk, you may be screened for HIV during your third trimester of pregnancy. °FALSE LABOR °You may feel small, irregular contractions that eventually go away. These are called Braxton Hicks contractions, or false labor. Contractions may last for hours, days, or even weeks before true labor sets in. If contractions come at regular intervals, intensify, or become painful, it is best to be seen by your  caregiver.  °SIGNS OF LABOR  °· Menstrual-like cramps. °· Contractions that are 5 minutes apart or less. °· Contractions that start on the top of the uterus and spread down to the lower abdomen and back. °· A sense of increased pelvic pressure or back pain. °· A watery or bloody mucus discharge that comes from the vagina. °If you have any of these signs before the 37th week of pregnancy, call your caregiver right away. You need to go to the hospital to get checked immediately. °HOME CARE INSTRUCTIONS  °· Avoid all smoking, herbs, alcohol, and unprescribed drugs. These chemicals affect the formation and growth of the baby. °· Do not use any tobacco products, including cigarettes, chewing tobacco, and electronic cigarettes. If you need help quitting, ask your health care provider. You may receive counseling support and other resources to help you quit. °· Follow your caregiver's instructions regarding medicine use. There are medicines that are either safe or unsafe to take during pregnancy. °· Exercise only as directed by your caregiver. Experiencing uterine cramps is a good sign to stop exercising. °· Continue to eat regular, healthy meals. °· Wear a good support bra for breast tenderness. °· Do not use hot tubs, steam rooms, or saunas. °· Wear your seat belt at all times when driving. °· Avoid raw meat, uncooked cheese, cat litter boxes, and soil used by cats. These carry germs that can cause birth defects in the baby. °· Take your prenatal vitamins. °· Take 1500-2000 mg of calcium daily starting at the 20th week of pregnancy until you deliver your baby. °· Try taking a stool softener (if your caregiver approves) if you develop constipation. Eat more high-fiber foods, such as fresh vegetables or fruit and whole grains. Drink plenty of fluids to keep your urine clear or pale yellow. °· Take warm sitz baths to soothe any pain or discomfort caused by hemorrhoids. Use hemorrhoid cream if your caregiver approves. °· If  you develop varicose veins, wear support hose. Elevate your feet for 15 minutes, 3-4 times a day. Limit salt in your diet. °· Avoid heavy lifting, wear low heal shoes, and practice good posture. °· Rest a lot with your legs elevated if you have leg cramps or low back pain. °· Visit your dentist if you have not gone during your pregnancy. Use a soft toothbrush to brush your teeth and be gentle when you floss. °· A sexual relationship may be continued unless your caregiver directs you otherwise. °· Do not travel far distances unless it is absolutely necessary and only with the approval of your caregiver. °· Take prenatal classes to understand, practice, and ask questions about the labor and delivery. °· Make a trial run to the hospital. °· Pack your hospital bag. °· Prepare the baby's nursery. °· Continue to go to all your prenatal visits as directed by your caregiver. °SEEK MEDICAL CARE IF: °· You are unsure if you are in labor or if your water has broken. °· You have dizziness. °· You have   mild pelvic cramps, pelvic pressure, or nagging pain in your abdominal area. °· You have persistent nausea, vomiting, or diarrhea. °· You have a bad smelling vaginal discharge. °· You have pain with urination. °SEEK IMMEDIATE MEDICAL CARE IF:  °· You have a fever. °· You are leaking fluid from your vagina. °· You have spotting or bleeding from your vagina. °· You have severe abdominal cramping or pain. °· You have rapid weight loss or gain. °· You have shortness of breath with chest pain. °· You notice sudden or extreme swelling of your face, hands, ankles, feet, or legs. °· You have not felt your baby move in over an hour. °· You have severe headaches that do not go away with medicine. °· You have vision changes. °  °This information is not intended to replace advice given to you by your health care provider. Make sure you discuss any questions you have with your health care provider. °  °Document Released: 10/12/2001 Document  Revised: 11/08/2014 Document Reviewed: 12/19/2012 °[employer] Interactive Patient Education ©2016 [employer] °Fetal Movement Counts °Patient Name: __________________________________________________ Patient Due Date: ____________________ °Performing a fetal movement count is highly recommended in high-risk pregnancies, but it is good for every pregnant woman to do. Your health care provider may ask you to start counting fetal movements at 28 weeks of the pregnancy. Fetal movements often increase: °· After eating a full meal. °· After physical activity. °· After eating or drinking something sweet or cold. °· At rest. °Pay attention to when you feel the baby is most active. This will help you notice a pattern of your baby's sleep and wake cycles and what factors contribute to an increase in fetal movement. It is important to perform a fetal movement count at the same time each day when your baby is normally most active.  °HOW TO COUNT FETAL MOVEMENTS °· Find a quiet and comfortable area to sit or lie down on your left side. Lying on your left side provides the best blood and oxygen circulation to your baby. °· Write down the day and time on a sheet of paper or in a journal. °· Start counting kicks, flutters, swishes, rolls, or jabs in a 2-hour period. You should feel at least 10 movements within 2 hours. °· If you do not feel 10 movements in 2 hours, wait 2-3 hours and count again. Look for a change in the pattern or not enough counts in 2 hours. °SEEK MEDICAL CARE IF: °· You feel less than 10 counts in 2 hours, tried twice. °· There is no movement in over an hour. °· The pattern is changing or taking longer each day to reach 10 counts in 2 hours. °· You feel the baby is not moving as he or she usually does. °Date: ____________ Movements: ____________ Start time: ____________ Finish time: ____________  °Date: ____________ Movements: ____________ Start time: ____________ Finish time: ____________ °Date: ____________  Movements: ____________ Start time: ____________ Finish time: ____________ °Date: ____________ Movements: ____________ Start time: ____________ Finish time: ____________ °Date: ____________ Movements: ____________ Start time: ____________ Finish time: ____________ °Date: ____________ Movements: ____________ Start time: ____________ Finish time: ____________ °Date: ____________ Movements: ____________ Start time: ____________ Finish time: ____________ °Date: ____________ Movements: ____________ Start time: ____________ Finish time: ____________  °Date: ____________ Movements: ____________ Start time: ____________ Finish time: ____________ °Date: ____________ Movements: ____________ Start time: ____________ Finish time: ____________ °Date: ____________ Movements: ____________ Start time: ____________ Finish time: ____________ °Date: ____________ Movements: ____________ Start time: ____________ Finish time: ____________ °Date:   ____________ Movements: ____________ Start time: ____________ Finish time: ____________ °Date: ____________ Movements: ____________ Start time: ____________ Finish time: ____________ °Date: ____________ Movements: ____________ Start time: ____________ Finish time: ____________  °Date: ____________ Movements: ____________ Start time: ____________ Finish time: ____________ °Date: ____________ Movements: ____________ Start time: ____________ Finish time: ____________ °Date: ____________ Movements: ____________ Start time: ____________ Finish time: ____________ °Date: ____________ Movements: ____________ Start time: ____________ Finish time: ____________ °Date: ____________ Movements: ____________ Start time: ____________ Finish time: ____________ °Date: ____________ Movements: ____________ Start time: ____________ Finish time: ____________ °Date: ____________ Movements: ____________ Start time: ____________ Finish time: ____________  °Date: ____________ Movements: ____________ Start time:  ____________ Finish time: ____________ °Date: ____________ Movements: ____________ Start time: ____________ Finish time: ____________ °Date: ____________ Movements: ____________ Start time: ____________ Finish time: ____________ °Date: ____________ Movements: ____________ Start time: ____________ Finish time: ____________ °Date: ____________ Movements: ____________ Start time: ____________ Finish time: ____________ °Date: ____________ Movements: ____________ Start time: ____________ Finish time: ____________ °Date: ____________ Movements: ____________ Start time: ____________ Finish time: ____________  °Date: ____________ Movements: ____________ Start time: ____________ Finish time: ____________ °Date: ____________ Movements: ____________ Start time: ____________ Finish time: ____________ °Date: ____________ Movements: ____________ Start time: ____________ Finish time: ____________ °Date: ____________ Movements: ____________ Start time: ____________ Finish time: ____________ °Date: ____________ Movements: ____________ Start time: ____________ Finish time: ____________ °Date: ____________ Movements: ____________ Start time: ____________ Finish time: ____________ °Date: ____________ Movements: ____________ Start time: ____________ Finish time: ____________  °Date: ____________ Movements: ____________ Start time: ____________ Finish time: ____________ °Date: ____________ Movements: ____________ Start time: ____________ Finish time: ____________ °Date: ____________ Movements: ____________ Start time: ____________ Finish time: ____________ °Date: ____________ Movements: ____________ Start time: ____________ Finish time: ____________ °Date: ____________ Movements: ____________ Start time: ____________ Finish time: ____________ °Date: ____________ Movements: ____________ Start time: ____________ Finish time: ____________ °Date: ____________ Movements: ____________ Start time: ____________ Finish time: ____________  °Date:  ____________ Movements: ____________ Start time: ____________ Finish time: ____________ °Date: ____________ Movements: ____________ Start time: ____________ Finish time: ____________ °Date: ____________ Movements: ____________ Start time: ____________ Finish time: ____________ °Date: ____________ Movements: ____________ Start time: ____________ Finish time: ____________ °Date: ____________ Movements: ____________ Start time: ____________ Finish time: ____________ °Date: ____________ Movements: ____________ Start time: ____________ Finish time: ____________ °Date: ____________ Movements: ____________ Start time: ____________ Finish time: ____________  °Date: ____________ Movements: ____________ Start time: ____________ Finish time: ____________ °Date: ____________ Movements: ____________ Start time: ____________ Finish time: ____________ °Date: ____________ Movements: ____________ Start time: ____________ Finish time: ____________ °Date: ____________ Movements: ____________ Start time: ____________ Finish time: ____________ °Date: ____________ Movements: ____________ Start time: ____________ Finish time: ____________ °Date: ____________ Movements: ____________ Start time: ____________ Finish time: ____________ °  °This information is not intended to replace advice given to you by your health care provider. Make sure you discuss any questions you have with your health care provider. °  °Document Released: 11/17/2006 Document Revised: 11/08/2014 Document Reviewed: 08/14/2012 °[employer] Interactive Patient Education ©2016 [employer] °Braxton Hicks Contractions °Contractions of the uterus can occur throughout pregnancy. Contractions are not always a sign that you are in labor.  °WHAT ARE BRAXTON HICKS CONTRACTIONS?  °Contractions that occur before labor are called Braxton Hicks contractions, or false labor. Toward the end of pregnancy (32-34 weeks), these contractions can develop more often and may become more  forceful. This is not true labor because these contractions do not result in opening (dilatation) and thinning of the cervix. They are sometimes difficult to tell apart from true labor because these contractions can be forceful and people have different pain tolerances. You should   not feel embarrassed if you go to the hospital with false labor. Sometimes, the only way to tell if you are in true labor is for your health care provider to look for changes in the cervix. °If there are no prenatal problems or other health problems associated with the pregnancy, it is completely safe to be sent home with false labor and await the onset of true labor. °HOW CAN YOU TELL THE DIFFERENCE BETWEEN TRUE AND FALSE LABOR? °False Labor °· The contractions of false labor are usually shorter and not as hard as those of true labor.   °· The contractions are usually irregular.   °· The contractions are often felt in the front of the lower abdomen and in the groin.   °· The contractions may go away when you walk around or change positions while lying down.   °· The contractions get weaker and are shorter lasting as time goes on.   °· The contractions do not usually become progressively stronger, regular, and closer together as with true labor.   °True Labor °· Contractions in true labor last 30-70 seconds, become very regular, usually become more intense, and increase in frequency.   °· The contractions do not go away with walking.   °· The discomfort is usually felt in the top of the uterus and spreads to the lower abdomen and low back.   °· True labor can be determined by your health care provider with an exam. This will show that the cervix is dilating and getting thinner.   °WHAT TO REMEMBER °· Keep up with your usual exercises and follow other instructions given by your health care provider.   °· Take medicines as directed by your health care provider.   °· Keep your regular prenatal appointments.   °· Eat and drink lightly if you  think you are going into labor.   °· If Braxton Hicks contractions are making you uncomfortable:   °· Change your position from lying down or resting to walking, or from walking to resting.   °· Sit and rest in a tub of warm water.   °· Drink 2-3 glasses of water. Dehydration may cause these contractions.   °· Do slow and deep breathing several times an hour.   °WHEN SHOULD I SEEK IMMEDIATE MEDICAL CARE? °Seek immediate medical care if: °· Your contractions become stronger, more regular, and closer together.   °· You have fluid leaking or gushing from your vagina.   °· You have a fever.   °· You pass blood-tinged mucus.   °· You have vaginal bleeding.   °· You have continuous abdominal pain.   °· You have low back pain that you never had before.   °· You feel your baby's head pushing down and causing pelvic pressure.   °· Your baby is not moving as much as it used to.   °  °This information is not intended to replace advice given to you by your health care provider. Make sure you discuss any questions you have with your health care provider. °  °Document Released: 10/18/2005 Document Revised: 10/23/2013 Document Reviewed: 07/30/2013 °[employer]vier Interactive Patient Education ©2016 [employer] ° °

## 2016-02-06 NOTE — Progress Notes (Signed)
Patient ID: Lisa Vincent, female   DOB: 06/07/1991, 25 y.o.   MRN: [id number]  Comfortable now after second epidural placement; has had prolonged decels x two in the past few hours; also had SROM of thick MSF  EFM 170s, decreased variability, 10x10 accels, occ mi variables Ctx irreg q 3-5 mins Cx 6/80/-3, not well applied; IUPC inserted  Blood pressure 115/71, pulse 86, temperature 98.3 F (36.8 C), temperature source Oral, resp. rate 20, height 5\' 2"  (1.575 m), weight 89.359 kg (197 lb), last menstrual period 04/27/2015, SpO2 96 %.  Protracted active labor, prob secondary to inadequate ctx Fetal tachycardia (no maternal fever) SROM GBS neg  Have been watching FHR Begin Pitocin as initial MVUs are not adequate Pt and family prepared for C/S if necessary   Cam HaiSHAW, Belladonna Lubinski CNM 02/06/2016 7:20 PM

## 2016-02-06 NOTE — Progress Notes (Signed)
Notified of pt return from u/s and BPP of 8/8. Will discharge home and return for induction

## 2016-02-06 NOTE — Op Note (Signed)
Preoperative diagnosis:  1.  Intrauterine pregnancy at 2623w5d  weeks gestation                                         2.  Non reassuring Fetal Heart Rate tracing, repetitive fetal bradycardic episodes                                         3.  Meconium stained amniotic fluid                                         4.  Arrested 1st stage of labor   Postoperative diagnosis:  Same as above plus asynclitism  Procedure:  Primary cesarean section  Surgeon:  Lazaro ArmsLuther H Eure MD  Assistant:    Anesthesia:epidural  Findings:  .    Over a low transverse incision was delivered a viable female with Apgars of pending and pending weighing lbs.  oz. Uterus, tubes and ovaries were all normal.  There were no other significant findings  Description of operation:  Patient was taken to the operating room and placed in the sitting position where she underwent a spinal anesthetic. She was then placed in the supine position with tilt to the left side. When adequate anesthetic level was obtained she was prepped and draped in usual sterile fashion and a Foley catheter was placed. A Pfannenstiel skin incision was made and carried down sharply to the rectus fascia which was scored in the midline extended laterally. The fascia was taken off the muscles both superiorly and without difficulty. The muscles were divided.  The peritoneal cavity was entered.  Bladder blade was placed, no bladder flap was created.  A low transverse hysterotomy incision was made and delivered a viable female  infant at 2129 with Apgars of  and pending weighing lbs  oz.  Cord pH was obtained and was pending. The uterus was exteriorized. It was closed in 2 layers, the first being a running interlocking layer and the second being an imbricating layer using 0 monocryl on a CTX needle. There was good resulting hemostasis. The uterus tubes and ovaries were all normal. Peritoneal cavity was irrigated vigorously. The muscles and peritoneum were  reapproximated loosely. The fascia was closed using 0 Vicryl in running fashion. Subcutaneous tissue was made hemostatic and irrigated. The skin was closed using 4-0 Vicryl on a Keith needle in a subcuticular fashion.  Liquiban was placed for additional wound integrity and to serve as a barrier. Blood loss for the procedure was 650 cc. The patient received a gram of Ancef prophylactically. The patient was taken to the recovery room in good stable condition with all counts being correct x3.  EBL 650 cc  EURE,LUTHER H 02/06/2016 10:00 PM

## 2016-02-06 NOTE — H&P (Signed)
Lisa Vincent is a 25 y.o. female presenting for contractions and increasing decreased fetal movement.  Seen in MAU earlier today for vaginal bleeding.  BPP 8/8.  Discharged home.  Reports that pain is increasing.    [facility] beginning at 12 wks of pregnancy.  Pregnancy uncomplicated and utilized BabyScripts.  IOL scheduled for this Sunday at 41 wks.    History OB History    Gravida Para Term Preterm AB TAB SAB Ectopic Multiple Living   2    1 1          Past Medical History  Diagnosis Date  . Medical history non-contributory    Past Surgical History  Procedure Laterality Date  . Hernia repair     Family History: family history includes Hypertension in her maternal aunt, maternal grandmother, maternal uncle, and mother. Social History:  reports that she has never smoked. She has never used smokeless tobacco. She reports that she does not drink alcohol or use illicit drugs.   Prenatal Transfer Tool  Maternal Diabetes: No Genetic Screening: Normal Maternal Ultrasounds/Referrals: Normal Fetal Ultrasounds or other Referrals:  None Maternal Substance Abuse:  No Significant Maternal Medications:  None Significant Maternal Lab Results:  Lab values include: Group B Strep negative Other Comments:  Fetal decel in MAU  ROS  Dilation: 4 Effacement (%): 100 Station: -2 Exam by:: Margarita MailW. Karim, CNM Blood pressure 144/69, pulse 83, temperature 97.8 F (36.6 C), temperature source Oral, resp. rate 20, height 5\' 2"  (1.575 m), weight 89.359 kg (197 lb), last menstrual period 04/27/2015, SpO2 98 %. Maternal Exam:  Introitus: Vagina is positive for vaginal discharge (thick green, mucus-like discharge (BBOW - yellowish/green discharge)).    Physical Exam  Constitutional: She is oriented to person, place, and time. She appears well-developed and well-nourished.  HENT:  Head: Normocephalic.  Neck: Normal range of motion. Neck supple.  Cardiovascular: Normal rate,  regular rhythm and normal heart sounds.   Respiratory: Effort normal and breath sounds normal. No respiratory distress.  GI: Soft. There is no tenderness.  Genitourinary: No bleeding in the vagina. Vaginal discharge (thick green, mucus-like discharge (BBOW - yellowish/green discharge)) found.  Musculoskeletal: Normal range of motion. She exhibits no edema.  Neurological: She is alert and oriented to person, place, and time.  Skin: Skin is warm and dry.    Dilation: 4 Effacement (%): 100 Station: -2 Presentation: Vertex Exam by:: Margarita MailW. Karim, CNM   Prenatal labs: ABO, Rh: A/Positive/-- (08/30 0000) Antibody: Negative (08/30 0000) Rubella: Immune (08/30 0000) RPR: NON REAC (01/10 1130)  HBsAg: Negative (08/30 0000)  HIV: NONREACTIVE (01/10 1130)  GBS: Negative (03/07 0000)   FHR 150's, 10x10 UC's 3-5/audible  Assessment/Plan: 25 y.o. G2P0010 at 457w5d IUP Active Labor GBS negative Elevated Blood Pressure  Plan: Admit to [facility] Suit[facility] Epidural as needed CMP, PR:CR added Obtain Rubella and Hep B (results not found within record) Marlis EdelsonWalidah N Karim, CNM    Elenora FenderKARIM, [facility], IncorporatedWALIDAH N 02/06/2016, 2:53 PM

## 2016-02-06 NOTE — MAU Note (Signed)
Pt states since discharged this morning, has been feeling contractions every 3-5 minutes 8/10.  Pt states has had continued decreased feta; movement.  Pt denies vaginal bleeding/ discharge.

## 2016-02-06 NOTE — Consult Note (Signed)
Neonatology Note:   Attendance at C-section:    I was asked by Dr. Despina HiddenEure to attend this urgent C/S at term for NRFHT with meconium. The mother is a G2P1, GBS negative. ROM at 1100 same day of delivery, fluid meconium. Infant not vigorous and without good spontaneous cry and tone. Needed short course PPV for low HR on initial assessment.  HR responded quickly to >100 and switched support to CPAP.  SaO2 placed and fio2 adjusted for minute of life.  Oropharynx bulb suctioned then nares deep suctioned with moderate amount of meconium removed.  At ~6 minutes blow by O2 given (no CPAP) and then by 8 min it was removed.  Ap 4/8. Lungs clear to ausc in DR. No wob. Regular rate. Sao2 mid 90s. D/w CN staff. To CN to care of Pediatrician with further transitional monitoring if needed. Contact NICU if concerns.    Jamie Brookesavid Ehrmann, MD

## 2016-02-06 NOTE — Transfer of Care (Signed)
Immediate Anesthesia Transfer of Care Note  Patient: Marchelle Folksmanda Helming  Procedure(s) Performed: Procedure(s): CESAREAN SECTION (N/A)  Patient Location: PACU  Anesthesia Type:Epidural  Level of Consciousness: awake, alert  and oriented  Airway & Oxygen Therapy: Patient Spontanous Breathing  Post-op Assessment: Report given to RN and Post -op Vital signs reviewed and stable  Post vital signs: Reviewed and stable  Last Vitals:  Filed Vitals:   02/06/16 1930 02/06/16 2021  BP:    Pulse:    Temp: 37.7 C 38 C  Resp:      Complications: No apparent anesthesia complications

## 2016-02-06 NOTE — Anesthesia Procedure Notes (Addendum)
Epidural Patient location during procedure: OB  Staffing Anesthesiologist: Marcene DuosFITZGERALD, Romaine Neville  Preanesthetic Checklist Completed: patient identified, site marked, surgical consent, pre-op evaluation, timeout performed, IV checked, risks and benefits discussed and monitors and equipment checked  Epidural Patient position: sitting Prep: site prepped and draped and DuraPrep Patient monitoring: continuous pulse ox and blood pressure Approach: midline Location: L4-L5 Injection technique: LOR saline  Needle:  Needle type: Tuohy  Needle gauge: 17 G Needle length: 9 cm and 9 Needle insertion depth: 6 cm Catheter type: closed end flexible Catheter size: 19 Gauge Catheter at skin depth: 12 (11cm at skin initially. Advanced to 12cm when laid in left lat decub.) cm Test dose: negative  Assessment Events: blood not aspirated, injection not painful, no injection resistance, negative IV test and no paresthesia  Epidural Patient location during procedure: OB  Staffing Anesthesiologist: Marcene DuosFITZGERALD, Daija Routson Performed by: anesthesiologist   Preanesthetic Checklist Completed: patient identified, site marked, surgical consent, pre-op evaluation, timeout performed, IV checked, risks and benefits discussed and monitors and equipment checked  Epidural Patient position: sitting Prep: site prepped and draped and DuraPrep Patient monitoring: continuous pulse ox and blood pressure Approach: midline Location: L3-L4 Injection technique: LOR saline  Needle:  Needle type: Tuohy  Needle gauge: 17 G Needle length: 9 cm and 9 Needle insertion depth: 7 cm Catheter type: closed end flexible Catheter size: 19 Gauge Catheter at skin depth: 13 (12 initially at the skin. Advanced to 13cm when laid in left lat decub.) cm Test dose: negative  Assessment Events: blood not aspirated, injection not painful, no injection resistance, negative IV test and no paresthesia  Additional Notes T8 levels  bilaterally to ice post-procedure.

## 2016-02-06 NOTE — MAU Note (Signed)
Pt presents complaining of vaginal bleeding when she wipes. States she called the on call nurse and was told to come in. Some cramping. No leaking of fluid. Reports good fetal movement.

## 2016-02-07 DIAGNOSIS — Z98891 History of uterine scar from previous surgery: Secondary | ICD-10-CM

## 2016-02-07 LAB — CBC
HEMATOCRIT: 32.8 % — AB (ref 36.0–46.0)
HEMOGLOBIN: 11.2 g/dL — AB (ref 12.0–15.0)
MCH: 32.9 pg (ref 26.0–34.0)
MCHC: 34.1 g/dL (ref 30.0–36.0)
MCV: 96.5 fL (ref 78.0–100.0)
Platelets: 119 10*3/uL — ABNORMAL LOW (ref 150–400)
RBC: 3.4 MIL/uL — AB (ref 3.87–5.11)
RDW: 13.5 % (ref 11.5–15.5)
WBC: 13.5 10*3/uL — AB (ref 4.0–10.5)

## 2016-02-07 LAB — RPR: RPR: NONREACTIVE

## 2016-02-07 LAB — RUBELLA SCREEN: Rubella: 5.65 index (ref >0.99)

## 2016-02-07 MED ORDER — SIMETHICONE 80 MG PO CHEW
80.0000 mg | CHEWABLE_TABLET | Freq: Three times a day (TID) | ORAL | Status: DC
  Administered 2016-02-07 – 2016-02-08 (×5): 80 mg via ORAL
  Filled 2016-02-07 (×5): qty 1

## 2016-02-07 MED ORDER — WITCH HAZEL-GLYCERIN EX PADS: 1.0000 "application " | MEDICATED_PAD | CUTANEOUS | Status: DC | PRN

## 2016-02-07 MED ORDER — ACETAMINOPHEN 325 MG PO TABS: 650.0000 mg | ORAL_TABLET | ORAL | Status: DC | PRN

## 2016-02-07 MED ORDER — SIMETHICONE 80 MG PO CHEW: 80.0000 mg | CHEWABLE_TABLET | ORAL | Status: DC | PRN

## 2016-02-07 MED ORDER — IBUPROFEN 600 MG PO TABS
600.0000 mg | ORAL_TABLET | Freq: Four times a day (QID) | ORAL | Status: DC
  Administered 2016-02-07 – 2016-02-08 (×6): 600 mg via ORAL
  Filled 2016-02-07 (×6): qty 1

## 2016-02-07 MED ORDER — MENTHOL 3 MG MT LOZG: 1.0000 | LOZENGE | OROMUCOSAL | Status: DC | PRN

## 2016-02-07 MED ORDER — LANOLIN HYDROUS EX OINT: 1.0000 "application " | TOPICAL_OINTMENT | CUTANEOUS | Status: DC | PRN

## 2016-02-07 MED ORDER — LACTATED RINGERS IV SOLN
INTRAVENOUS | Status: DC
  Administered 2016-02-07 (×2): via INTRAVENOUS

## 2016-02-07 MED ORDER — DIBUCAINE 1 % RE OINT: 1.0000 "application " | TOPICAL_OINTMENT | RECTAL | Status: DC | PRN

## 2016-02-07 MED ORDER — PRENATAL MULTIVITAMIN CH
1.0000 | ORAL_TABLET | Freq: Every day | ORAL | Status: DC
  Administered 2016-02-07 – 2016-02-08 (×2): 1 via ORAL
  Filled 2016-02-07 (×2): qty 1

## 2016-02-07 MED ORDER — DIPHENHYDRAMINE HCL 25 MG PO CAPS: 25.0000 mg | ORAL_CAPSULE | Freq: Four times a day (QID) | ORAL | Status: DC | PRN

## 2016-02-07 MED ORDER — METHYLERGONOVINE MALEATE 0.2 MG/ML IJ SOLN: 0.2000 mg | INTRAMUSCULAR | Status: DC | PRN

## 2016-02-07 MED ORDER — OXYTOCIN 10 UNIT/ML IJ SOLN: 2.5000 [IU]/h | INTRAVENOUS | Status: AC

## 2016-02-07 MED ORDER — TETANUS-DIPHTH-ACELL PERTUSSIS 5-2.5-18.5 LF-MCG/0.5 IM SUSP: 0.5000 mL | Freq: Once | INTRAMUSCULAR | Status: DC

## 2016-02-07 MED ORDER — DEXTROSE 5 % IV SOLN
2.0000 g | Freq: Four times a day (QID) | INTRAVENOUS | Status: AC
  Administered 2016-02-07 (×4): 2 g via INTRAVENOUS
  Filled 2016-02-07 (×5): qty 2

## 2016-02-07 MED ORDER — ZOLPIDEM TARTRATE 5 MG PO TABS: 5.0000 mg | ORAL_TABLET | Freq: Every evening | ORAL | Status: DC | PRN

## 2016-02-07 MED ORDER — SENNOSIDES-DOCUSATE SODIUM 8.6-50 MG PO TABS
2.0000 | ORAL_TABLET | ORAL | Status: DC
  Administered 2016-02-07: 2 via ORAL
  Filled 2016-02-07: qty 2

## 2016-02-07 MED ORDER — SIMETHICONE 80 MG PO CHEW
80.0000 mg | CHEWABLE_TABLET | ORAL | Status: DC
  Administered 2016-02-07: 80 mg via ORAL
  Filled 2016-02-07: qty 1

## 2016-02-07 MED ORDER — METHYLERGONOVINE MALEATE 0.2 MG PO TABS: 0.2000 mg | ORAL_TABLET | ORAL | Status: DC | PRN

## 2016-02-07 NOTE — Lactation Note (Addendum)
This note was copied from a baby's chart. Lactation Consultation Note  Patient Name: Lisa Candise Chemanda Missildine ONGEX'BToday's Date: 02/07/2016 Reason for consult: Initial assessment Baby 17 hours old. Mom concerned baby not latching deeply. However, baby just had her bath and is sleeping STS on mom's chest. Assisted mom with hand expression, with no colostrum present. Discussed normal progression of milk coming to volume, and supply and demand. Enc mom to offer lots of STS and attempts at the breast. Enc mom to call for her nurse to assist with latching or assess a latch as needed. Discussed cluster-feeding. Parents had questions about when baby should have a stool and void. Parents state that baby had a stool during delivery, and right after delivery.  [facility] given [facility] broch[facility], aware of OP/BFSG and LC phone line assistance after D/C.   Maternal Data Has patient been taught Hand Expression?: Yes Does the patient have breastfeeding experience prior to this delivery?: No  Feeding Feeding Type: Breast Fed Length of feed: 20 min  LATCH Score/Interventions                      Lactation Tools Discussed/Used     Consult Status Consult Status: Follow-up Date: 02/08/16 Follow-up type: In-patient    Geralynn OchsWILLIARD, Lisa Covington 02/07/2016, 3:24 PM

## 2016-02-07 NOTE — Progress Notes (Signed)
Subjective: Postpartum Day #1: Cesarean Delivery Patient reports feeling well; foley still in; no flatus; breastfeeding going well; desires Mirena  Objective: Vital signs in last 24 hours: Temp:  [97.8 F (36.6 C)-100.4 F (38 C)] 98.7 F (37.1 C) (04/08 0400) Pulse Rate:  [68-205] 68 (04/08 0400) Resp:  [12-24] 18 (04/08 0400) BP: (91-171)/(51-125) 109/60 mmHg (04/08 0400) SpO2:  [94 %-100 %] 96 % (04/08 0400) Weight:  [89.359 kg (197 lb)] 89.359 kg (197 lb) (04/07 1436)  Physical Exam:  General: alert, cooperative and no distress Lochia: appropriate Uterine Fundus: firm Incision: honeycomb dsg intact DVT Evaluation: No evidence of DVT seen on physical exam.   Recent Labs  02/06/16 1407  HGB 12.5  HCT 35.7*     Assessment/Plan: Status post Cesarean section. Doing well postoperatively.  Continue current care. CBC pending.  Cam HaiSHAW, Vipul Cafarelli CNM 02/07/2016, 8:08 AM

## 2016-02-07 NOTE — Addendum Note (Signed)
Addendum  created 02/07/16 1030 by Algis GreenhouseLinda A Marilena Trevathan, CRNA   Modules edited: Clinical Notes   Clinical Notes:  File: [id number]

## 2016-02-07 NOTE — Anesthesia Postprocedure Evaluation (Signed)
Anesthesia Post Note  Patient: Lisa Vincent  Procedure(s) Performed: Procedure(s) (LRB): CESAREAN SECTION (N/A)  Patient location during evaluation: Mother Baby Anesthesia Type: Epidural Level of consciousness: awake Pain management: satisfactory to patient Vital Signs Assessment: post-procedure vital signs reviewed and stable Respiratory status: spontaneous breathing Cardiovascular status: stable Anesthetic complications: no    Last Vitals:  Filed Vitals:   02/07/16 0400 02/07/16 0819  BP: 109/60 112/70  Pulse: 68 86  Temp: 37.1 C 36.8 C  Resp: 18 18    Last Pain:  Filed Vitals:   02/07/16 0821  PainSc: 0-No pain                 Deyona Soza

## 2016-02-08 ENCOUNTER — Inpatient Hospital Stay (HOSPITAL_COMMUNITY): Admission: RE | Admit: 2016-02-08 | Payer: BC Managed Care – PPO | Source: Ambulatory Visit

## 2016-02-08 ENCOUNTER — Ambulatory Visit: Payer: Self-pay

## 2016-02-08 MED ORDER — IBUPROFEN 600 MG PO TABS: 600.0000 mg | ORAL_TABLET | Freq: Four times a day (QID) | ORAL | Status: AC

## 2016-02-08 MED ORDER — ACETAMINOPHEN 325 MG PO TABS: 650.0000 mg | ORAL_TABLET | ORAL | Status: AC | PRN

## 2016-02-08 MED ORDER — OXYCODONE HCL 5 MG PO TABS: 5.0000 mg | ORAL_TABLET | ORAL | Status: DC | PRN

## 2016-02-08 NOTE — Lactation Note (Signed)
This note was copied from a baby's chart. Lactation Consultation Note  Patient Name: Lisa Vincent ZOXWR'UToday's Date: 02/08/2016 Reason for consult: Follow-up assessment Baby at 44 hr of life and RN requested a bf assessment. Baby was sleeping, mom had fed formula before entry. Mom attempted latch but baby was not interested. Mom stated that she had tried to manually express but has not seen any colostrum and is worried that baby is not getting enough to eat. Showed mom how to do breast massage and manually express without pinching the nipple. Colostrum was noted bilaterally. MGM present and had a lot of questions about supply, feeding frequency, voids, wt lose, and pumping. Mom will bf baby 8+/24 hr on demand and f/u with expressed milk as needed. She is aware of OP services and support group.   Maternal Data    Feeding Feeding Type: Breast Fed Length of feed: 0 min  LATCH Score/Interventions Latch: Too sleepy or reluctant, no latch achieved, no sucking elicited. Intervention(s): Skin to skin;Teach feeding cues;Waking techniques Intervention(s): Breast compression  Audible Swallowing: None Intervention(s): Hand expression Intervention(s): Skin to skin  Type of Nipple: Everted at rest and after stimulation  Comfort (Breast/Nipple): Soft / non-tender     Hold (Positioning): No assistance needed to correctly position infant at breast.  LATCH Score: 6  Lactation Tools Discussed/Used WIC Program: No   Consult Status Consult Status: Follow-up Date: 02/09/16 Follow-up type: In-patient    Rulon Eisenmengerlizabeth E Kenyon Eichelberger 02/08/2016, 6:10 PM

## 2016-02-08 NOTE — Discharge Instructions (Signed)
Cesarean Delivery, Care After  Refer to this sheet in the next few weeks. These instructions provide you with information on caring for yourself after your procedure. Your health care provider may also give you specific instructions. Your treatment has been planned according to current medical practices, but problems sometimes occur. Call your health care provider if you have any problems or questions after you go home.  HOME CARE INSTRUCTIONS   Only take over-the-counter or prescription medications as directed by your health care provider.   Do not drink alcohol, especially if you are breastfeeding or taking medication to relieve pain.   Do not chew or smoke tobacco.   Continue to use good perineal care. Good perineal care includes:    Wiping your perineum from front to back.    Keeping your perineum clean.   Check your surgical cut (incision) daily for increased redness, drainage, swelling, or separation of skin.   Clean your incision gently with soap and water every day, and then pat it dry. If your health care provider says it is okay, leave the incision uncovered. Use a bandage (dressing) if the incision is draining fluid or appears irritated. If the adhesive strips across the incision do not fall off within 7 days, carefully peel them off.   Hug a pillow when coughing or sneezing until your incision is healed. This helps to relieve pain.   Do not use tampons or douche until your health care provider says it is okay.   Shower, wash your hair, and take tub baths as directed by your health care provider.   Wear a well-fitting bra that provides breast support.   Limit wearing support panties or control-top hose.   Drink enough fluids to keep your urine clear or pale yellow.   Eat high-fiber foods such as whole grain cereals and breads, brown rice, beans, and fresh fruits and vegetables every day. These foods may help prevent or relieve constipation.   Resume activities such as climbing stairs,  driving, lifting, exercising, or traveling as directed by your health care provider.   Talk to your health care provider about resuming sexual activities. This is dependent upon your risk of infection, your rate of healing, and your comfort and desire to resume sexual activity.   Try to have someone help you with your household activities and your newborn for at least a few days after you leave the hospital.   Rest as much as possible. Try to rest or take a nap when your newborn is sleeping.   Increase your activities gradually.   Keep all of your scheduled postpartum appointments. It is very important to keep your scheduled follow-up appointments. At these appointments, your health care provider will be checking to make sure that you are healing physically and emotionally.  SEEK MEDICAL CARE IF:    You are passing large clots from your vagina. Save any clots to show your health care provider.   You have a foul smelling discharge from your vagina.   You have trouble urinating.   You are urinating frequently.   You have pain when you urinate.   You have a change in your bowel movements.   You have increasing redness, pain, or swelling near your incision.   You have pus draining from your incision.   Your incision is separating.   You have painful, hard, or reddened breasts.   You have a severe headache.   You have blurred vision or see spots.   You feel sad   or depressed.   You have thoughts of hurting yourself or your newborn.   You have questions about your care, the care of your newborn, or medications.   You are dizzy or light-headed.   You have a rash.   You have pain, redness, or swelling at the site of the removed intravenous access (IV) tube.   You have nausea or vomiting.   You stopped breastfeeding and have not had a menstrual period within 12 weeks of stopping.   You are not breastfeeding and have not had a menstrual period within 12 weeks of delivery.   You have a fever.  SEEK  IMMEDIATE MEDICAL CARE IF:   You have persistent pain.   You have chest pain.   You have shortness of breath.   You faint.   You have leg pain.   You have stomach pain.   Your vaginal bleeding saturates 2 or more sanitary pads in 1 hour.  MAKE SURE YOU:    Understand these instructions.   Will watch your condition.   Will get help right away if you are not doing well or get worse.     This information is not intended to replace advice given to you by your health care provider. Make sure you discuss any questions you have with your health care provider.     Document Released: 07/10/2002 Document Revised: 11/08/2014 Document Reviewed: 06/14/2012  Elsevier Interactive Patient Education 2016 [employer]vier Inc[employer]

## 2016-02-08 NOTE — Discharge Summary (Signed)
OB Discharge Summary     Patient Name: Lisa Vincent DOB: 22-Jun-1991 MRN: [id number]  Date of admission: 02/06/2016 Delivering MD: Lazaro Arms   Date of discharge: 02/08/2016  Admitting diagnosis: 40WKS, ABD PAIN,DECREASED FM Intrauterine pregnancy: [redacted]w[redacted]d     Secondary diagnosis:  Active Problems:   Active labor   S/P cesarean section  Additional problems: triple I     Discharge diagnosis: Term Pregnancy Delivered                                                                                                Post partum procedures:none  Augmentation: Pitocin  Complications: None  Hospital course:  Onset of Labor With Unplanned C/S  25 y.o. yo G2P1011 at [redacted]w[redacted]d was admitted in Active Labor on 02/06/2016. Patient had a labor course significant for recurrent decels and triple I diagnosed shortly prior to c/s. Membrane Rupture Time/Date: 4:20 PM ,02/06/2016   The patient went for cesarean section due to Non-Reassuring FHR, and delivered a Viable infant,02/06/2016  Details of operation can be found in separate operative note. Patient had an uncomplicated postpartum course.  She is ambulating,tolerating a regular diet, passing flatus, and urinating well.  Patient is discharged home in stable condition 02/08/2016.  Physical exam  Filed Vitals:   02/07/16 1600 02/07/16 1900 02/07/16 2015 02/08/16 0600  BP: 110/62 123/79 115/51 108/72  Pulse: 76 98 78 92  Temp: 98.1 F (36.7 C) 98.6 F (37 C) 97.9 F (36.6 C) 98 F (36.7 C)  TempSrc: Oral Oral Oral Oral  Resp: Height:      Weight:      SpO2:  98% 98% 100%   General: alert, cooperative and no distress Lochia: appropriate Uterine Fundus: firm Incision: No significant erythema, Dressing is clean, dry, and intact DVT Evaluation: No cords or calf tenderness. No significant calf/ankle edema. Labs: Lab Results  Component Value Date   WBC 13.5* 02/07/2016   HGB 11.2* 02/07/2016   HCT 32.8* 02/07/2016   MCV 96.5  02/07/2016   PLT 119* 02/07/2016   CMP Latest Ref Rng 02/06/2016  Glucose 65 - 99 mg/dL 99  BUN 6 - 20 mg/dL 8  Creatinine 4.09 - 8.11 mg/dL 9.14  Sodium 782 - 956 mmol/L 136  Potassium 3.5 - 5.1 mmol/L 4.0  Chloride 101 - 111 mmol/L 108  CO2 22 - 32 mmol/L 20(L)  Calcium 8.9 - 10.3 mg/dL 2.1(H)  Total Protein 6.5 - 8.1 g/dL 6.8  Total Bilirubin 0.3 - 1.2 mg/dL 0.9  Alkaline Phos 38 - 126 U/L 143(H)  AST 15 - 41 U/L 22  ALT 14 - 54 U/L 21    Discharge instruction: per After Visit Summary and "Baby and Me Booklet".  After visit meds:    Medication List    STOP taking these medications        cyclobenzaprine 10 MG tablet  Commonly known as:  FLEXERIL      TAKE these medications        acetaminophen 325 MG tablet  Commonly known as:  TYLENOL  Take 2  tablets (650 mg total) by mouth every 4 (four) hours as needed (for pain scale < 4).     ibuprofen 600 MG tablet  Commonly known as:  ADVIL,MOTRIN  Take 1 tablet (600 mg total) by mouth every 6 (six) hours.     oxyCODONE 5 MG immediate release tablet  Commonly known as:  ROXICODONE  Take 1 tablet (5 mg total) by mouth every 4 (four) hours as needed for severe pain.     PRENATAL VITAMIN PO  Take 1 tablet by mouth daily.        Diet: routine diet  Activity: Advance as tolerated. Pelvic rest for 6 weeks.   Outpatient follow up:6 weeks Follow up Appt:Future Appointments Date Time Provider Department Center  03/16/2016 2:00 PM Catalina AntiguaPeggy Constant, MD [facility]   Follow up Visit:No Follow-up on file.  Postpartum contraception: iud  Newborn Data: Live born female  Birth Weight: 7 lb 10.8 oz (3480 g) APGAR: 4, 8  Baby Feeding: Breast Disposition:home with mother   02/08/2016 Silvano BilisNoah B Aking Klabunde, MD

## 2016-02-09 ENCOUNTER — Ambulatory Visit: Payer: Self-pay

## 2016-02-09 ENCOUNTER — Telehealth: Payer: Self-pay | Admitting: *Deleted

## 2016-02-09 ENCOUNTER — Encounter (HOSPITAL_COMMUNITY): Payer: Self-pay | Admitting: Obstetrics & Gynecology

## 2016-02-09 DIAGNOSIS — Z98891 History of uterine scar from previous surgery: Secondary | ICD-10-CM

## 2016-02-09 MED ORDER — ABDOMINAL BINDER/ELASTIC LARGE MISC: 1.0000 | Status: DC | PRN

## 2016-02-09 NOTE — Telephone Encounter (Signed)
-----   Message from Olevia BowensJacinda S Battle sent at 02/09/2016 10:41 AM EDT ----- Regarding: Rx Request Contact: [phone] Needs a Rx placed for an abd binder

## 2016-02-09 NOTE — Lactation Note (Signed)
This note was copied from a baby's chart. Lactation Consultation Note: Mom has been bottle feeding formula through the night because she is not sure how much the baby is getting and baby has been fussy at the breast. Mom states she does want to breast feed baby. Dr Eddie Candleummings in to check baby. Offered DEBP and mom agreeable. Setup, use and cleaning reviewed with parents. Baby awake after MD check so attempted to latch. Baby latched well and nursed for 5 min, then off to sleep. Mom pumped for 15 min and obtained a few cc's . Mom pleased to see some Colostrum. Encouraged to put baby to the breast every feeding then pump afterwards to promote milk supply. Reviewed supply and demand,.2 week rental completed. OP appointment made for Thursday at 4 pm. No questions at present. To call prn  Patient Name: Lisa Vincent ZOXWR'UToday's Date: 02/09/2016 Reason for consult: Follow-up assessment   Maternal Data Formula Feeding for Exclusion: No Has patient been taught Hand Expression?: Yes Does the patient have breastfeeding experience prior to this delivery?: No  Feeding Feeding Type: Breast Fed Length of feed: 5 min  LATCH Score/Interventions Latch: Grasps breast easily, tongue down, lips flanged, rhythmical sucking.  Audible Swallowing: None  Type of Nipple: Everted at rest and after stimulation  Comfort (Breast/Nipple): Soft / non-tender     Hold (Positioning): Assistance needed to correctly position infant at breast and maintain latch. Intervention(s): Breastfeeding basics reviewed  LATCH Score: 7  Lactation Tools Discussed/Used WIC Program: No Pump Review: Setup, frequency, and cleaning Initiated by:: DW Date initiated:: 02/09/16   Consult Status Consult Status: Complete Date: 02/12/16 Follow-up type: Out-patient    Pamelia HoitWeeks, Nassir Neidert D 02/09/2016, 10:04 AM

## 2016-02-11 NOTE — Progress Notes (Signed)
Post discharge chart review completed.  

## 2016-02-12 ENCOUNTER — Ambulatory Visit (HOSPITAL_COMMUNITY)
Admission: RE | Admit: 2016-02-12 | Discharge: 2016-02-12 | Disposition: A | Discharge status: Home / Self Care | Payer: BC Managed Care – PPO | Source: Ambulatory Visit | Attending: Family Medicine | Admitting: Family Medicine

## 2016-02-12 NOTE — Lactation Note (Signed)
Lactation Consult: weight today : 7- 13.9 oz 3568 g Up 1.9 oz from Ped visit yesterday. Mom has not been putting the baby to the breast very often until yesterday when the Ped told her to nurse the baby every feeding, then supplement afterwards as needed. Mom has only been nursing on one breast at a feeding. Reviewed supply and demand Encouraged to breast feed on both breasts at every feeding. Encouraged pumping at least 4-6 times/day to promote milk supply Mom has only been pumping 3-4 times/day at most. Will still need to supplement after nursing until supply increases. Mom has bottle of formula with 150 ml in it. Has been using bottles for 2-3 feedings- placing it in refrigerator between feedings. Suggested only putting amount needed for 1 feeding in bottle at a time, No further questions at present. To call prn or if wants another OP appointment.  Mother's reason for visit:   For help with breast feeding- making sure I am making enough milk Visit Type:  Feeding assessment Appointment Notes:  Has been giving bottles of formula because she didn't think she was making enough milk Consult:  Initial Lactation Consultant:  Lisa Vincent, Lisa Vincent  ________________________________________________________________________    ________________________________________________________________________  Mother's Name: Lisa Vincent Type of delivery:   Breastfeeding Experience:  P1 ________________________________________________________________________  Breastfeeding History (Post Discharge)  Frequency of breastfeeding:  Since Ped visit yesterday she has been putting the baby to the breast every feeding then supplementing afterwards. Before that was only putting the baby to the breast a few times a day Duration of feeding:  30 min  Supplementation  Formula:  Volume 45 ml Frequency:  q feeding       Brand: Similac  Breastmilk:  Volume  15 ml at the most  Frequency:  When available  Method:  Bottle,  Tommy  Tippy  Pumping  Type of pump:  Symphony Frequency:  3-4 times/day at most- often less than that Volume:  15 ml  Infant Intake and Output Assessment  Voids:  4 in 24 hrs.  Color:  Clear yellow Stools:  2 in 24 hrs.  Color:  Brown and Yellow  ________________________________________________________________________  Maternal Breast Assessment  Breast:  Soft Nipple:  Erect  _______________________________________________________________________ Feeding Assessment/Evaluation  Initial feeding assessment:  Positioning:  Cradle Right breast  LATCH documentation:  Latch:  2 = Grasps breast easily, tongue down, lips flanged, rhythmical sucking.  Audible swallowing:  1 = A few with stimulation  Type of nipple:  2 = Everted at rest and after stimulation  Comfort (Breast/Nipple):  2 = Soft / non-tender  Hold (Positioning):  2 = No assistance needed to correctly position infant at breast  LATCH score:  9  Attached assessment:  Deep  Lips flanged:  Yes.    Lips untucked:  No.  Suck assessment:  Nutritive and Nonnutritive   Pre-feed weight:  3568 g 7-13.9 oz Post-feed weight:  3578 g 7-14.2 oz Amount transferred:  10 ml  Lisa EvansEmery latched well to right breast.Some smacking at the breast noted at the beginning of the feeding then baby got a good seal and no smacking noted.. Mom reports breasts have not really felt any fuller   Pre-feed weight:  35778-14.2 oz Post-feed weight:  3582 g 14- 14.4 oz Amount transferred:  4 ml  Lisa EvansEmery latched well to left breast. No smacking noted on this breast. Reviewed BF basics with mom.   Total amount transferred:  14 ml Total supplement given:  50 ml  Mom did  not bring pump with her

## 2016-02-20 ENCOUNTER — Ambulatory Visit (INDEPENDENT_AMBULATORY_CARE_PROVIDER_SITE_OTHER): Payer: BC Managed Care – PPO | Admitting: Obstetrics & Gynecology

## 2016-02-20 ENCOUNTER — Encounter: Payer: Self-pay | Admitting: Obstetrics & Gynecology

## 2016-02-20 VITALS — BP 122/84 | HR 84 | Resp 18 | Wt 177.0 lb

## 2016-02-20 DIAGNOSIS — Z9889 Other specified postprocedural states: Secondary | ICD-10-CM

## 2016-02-20 NOTE — Progress Notes (Signed)
   Subjective:    Patient ID: Lisa Vincent, female    DOB: 08/17/1991, 25 y.o.   MRN: [id number]  HPI 25 yo young lady 2 weeks post op s/p PLTCS for a routine incision check. She has no problems. She denies baby blues and has not had sex.   Review of Systems     Objective:   Physical Exam  [id number] Breathing, conversing, and ambulating normally Abd- benign Incision- healed well      Assessment & Plan:  Post op 2 weeks- doing well RTC 4 weeks for pp exam/Mirena

## 2016-02-20 NOTE — Progress Notes (Signed)
Pt here today for incision check, denies any fever, chills, or pain.

## 2016-03-04 IMAGING — US US MFM FETAL NUCHAL TRANSLUCENCY
1 series · 15 of 19 positions shown · non-contrast
Comparison: none

[Series 1: us mfm fetal nuchal translucency · 15 of 19 slices shown]
[im 1/19]
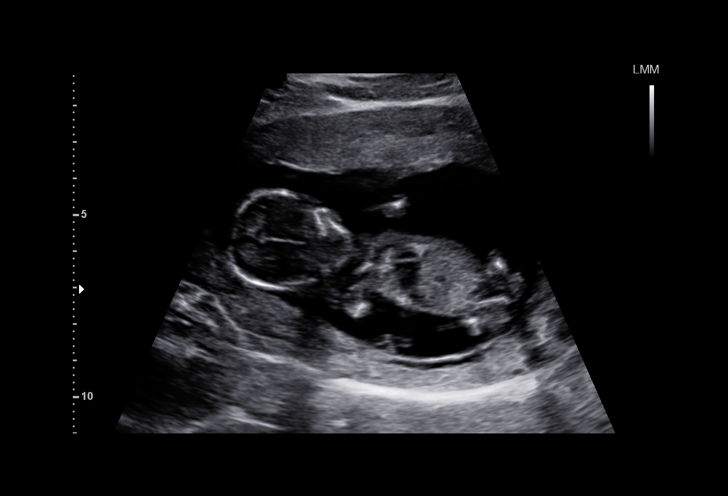
[im 2/19]
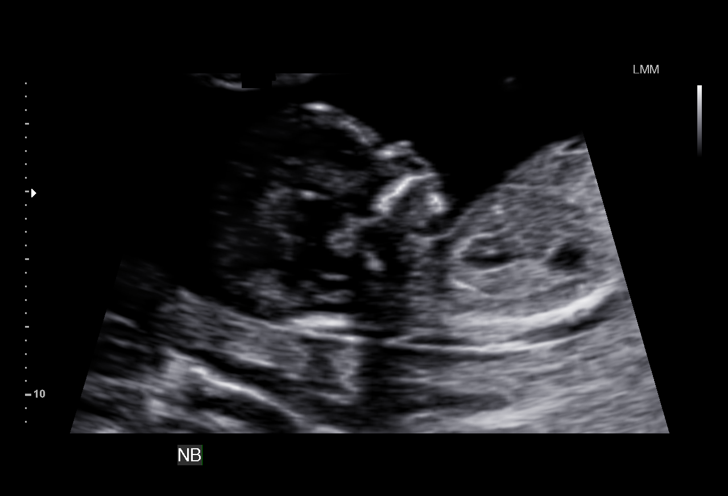
[im 4/19]
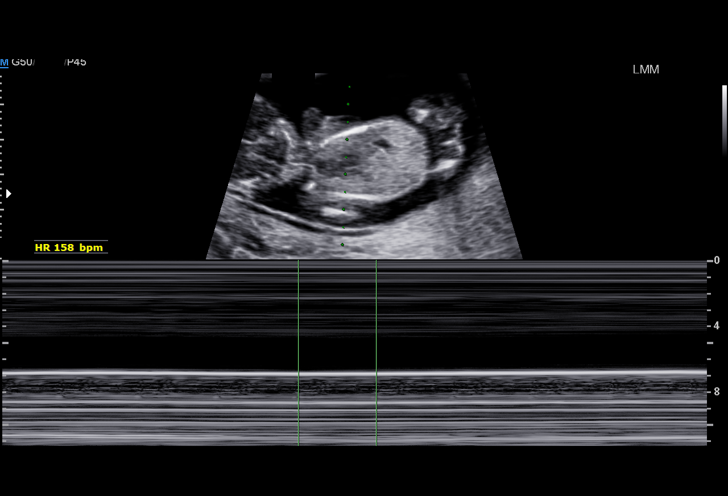
[im 5/19]
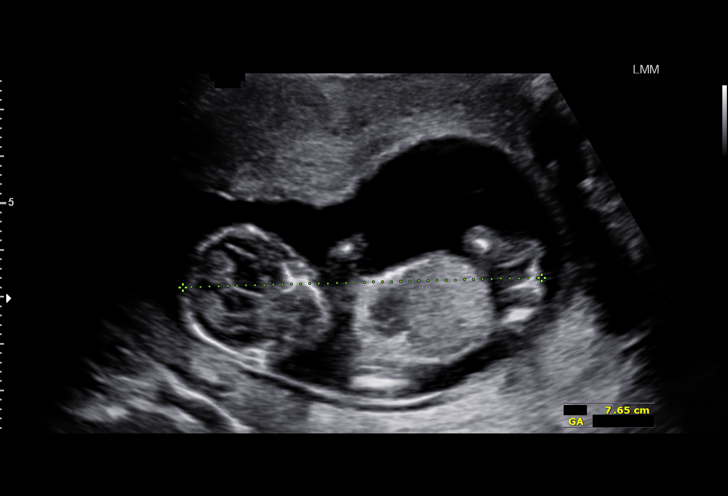
[im 6/19]
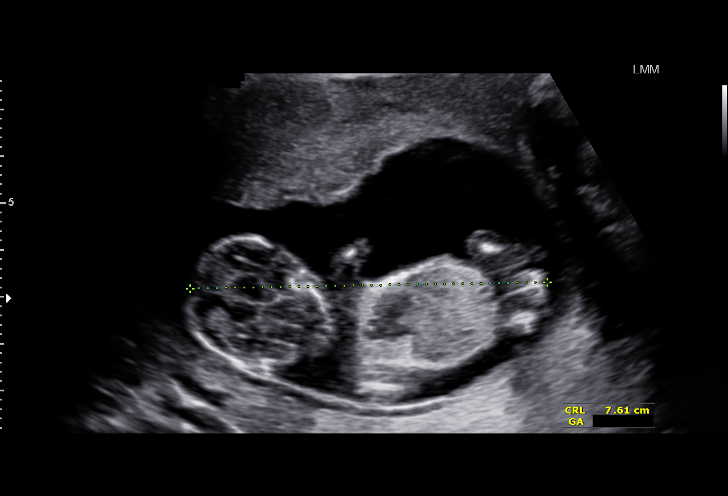
[im 7/19]
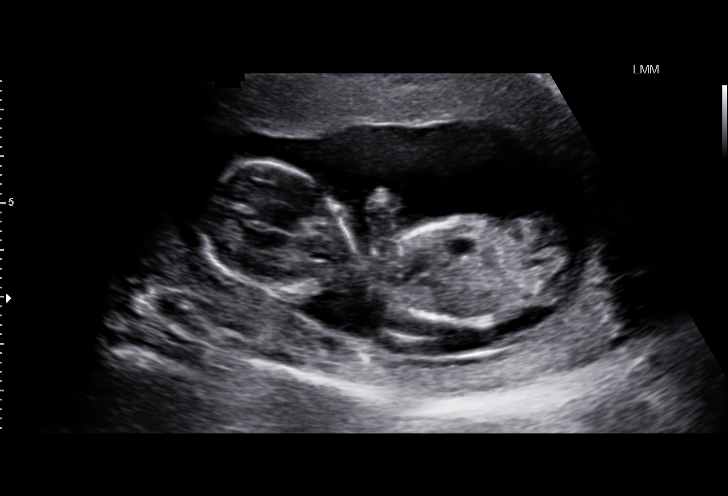
[im 9/19]
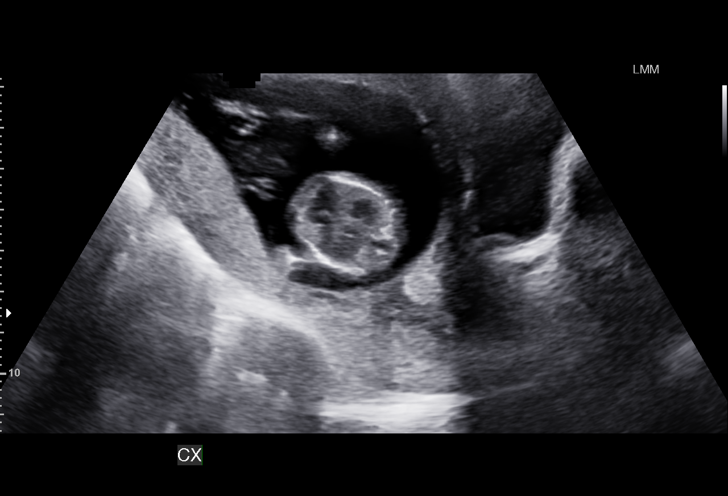
[im 10/19]
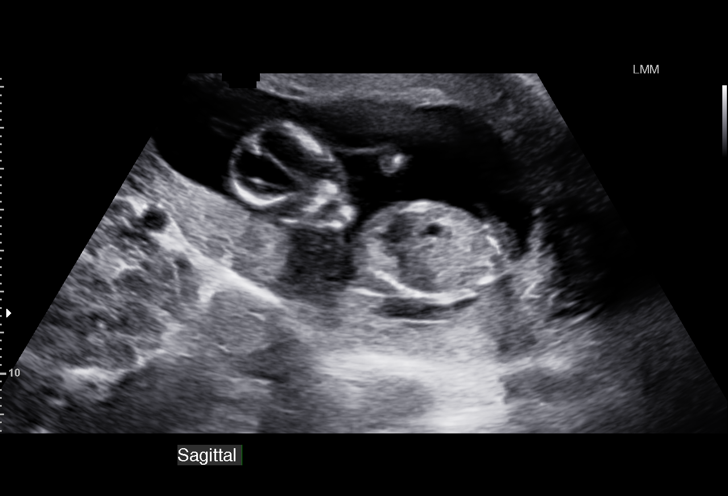
[im 11/19]
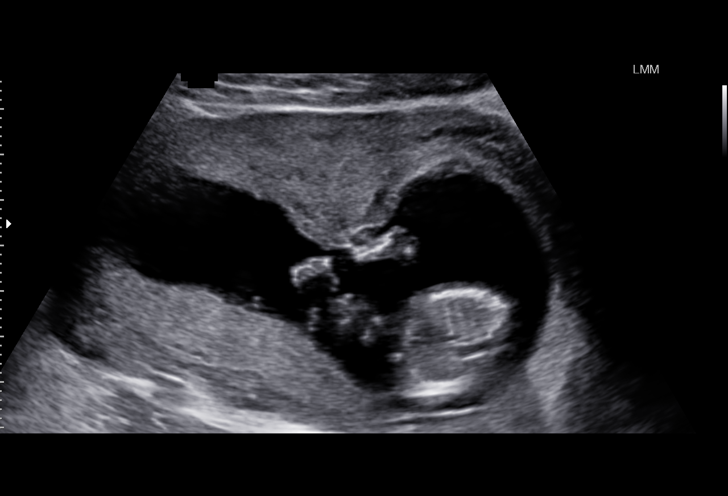
[im 13/19]
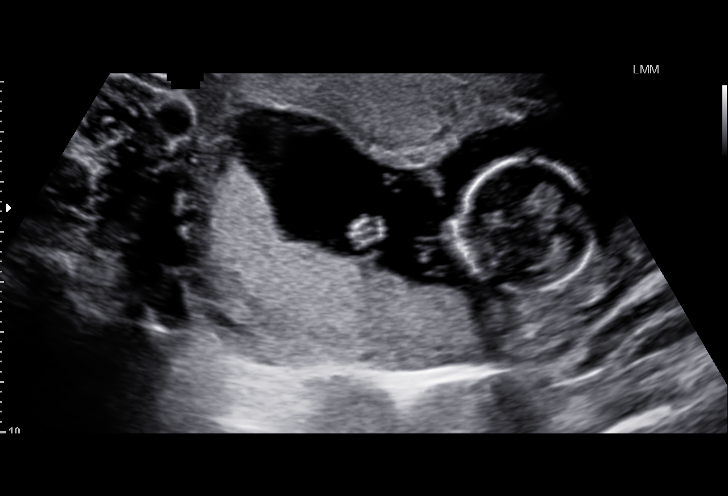
[im 14/19]
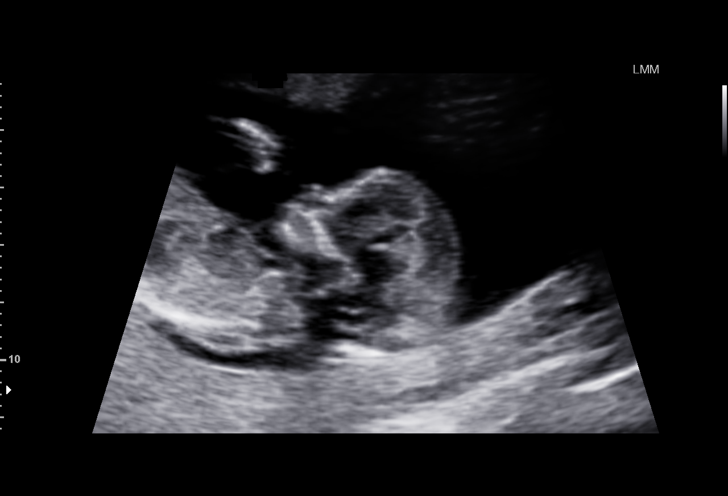
[im 15/19]
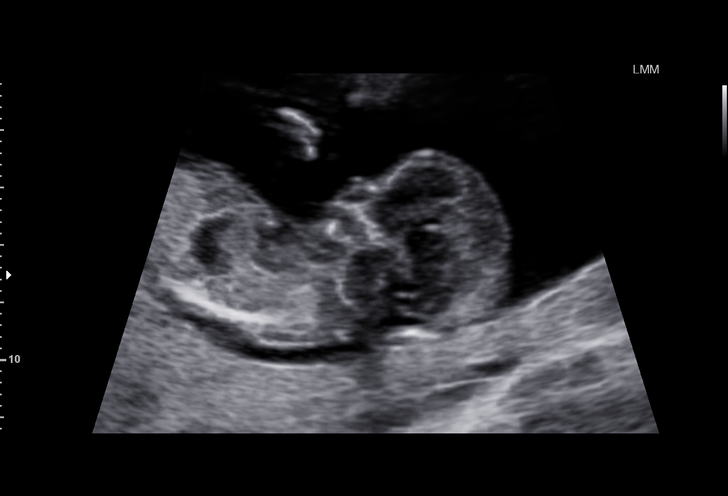
[im 16/19]
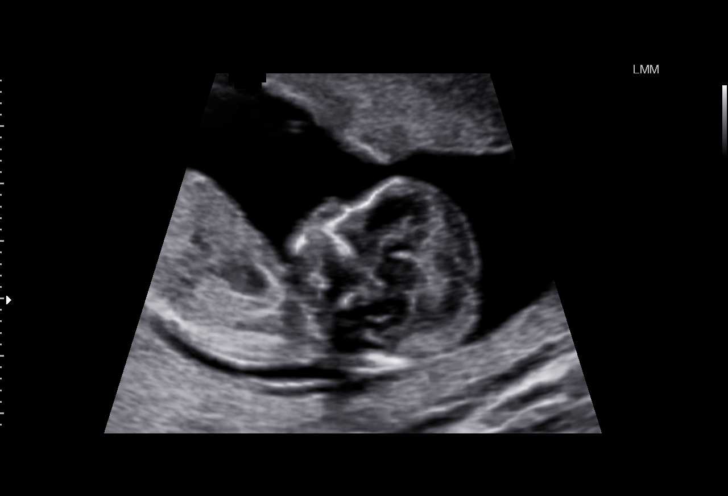
[im 18/19]
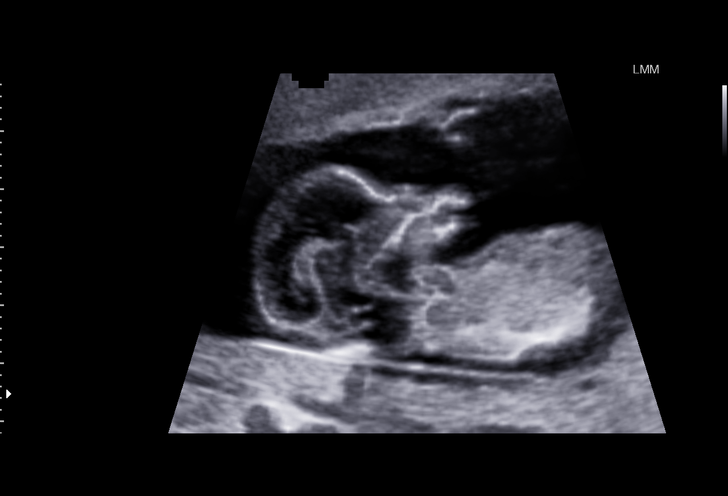
[im 19/19]
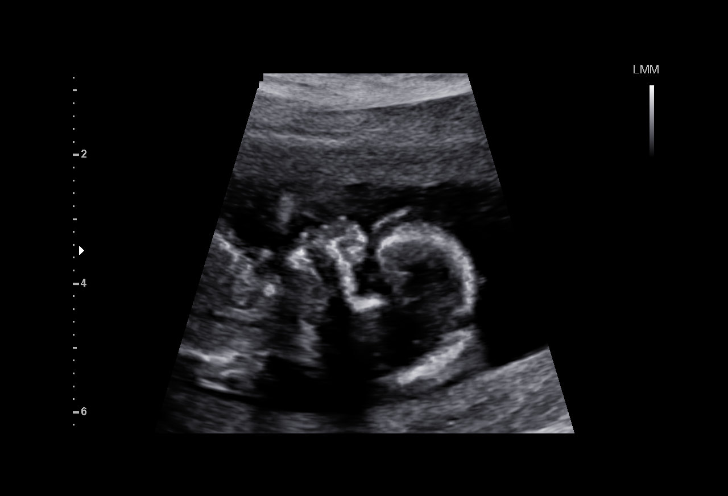

[15 of 19 positions shown; findings below may reference images not displayed]

OBSTETRICS REPORT
(Signed Final 08/01/2015 [DATE])

Name:       SCHEUCHER JOTANOVIC                          Visit  08/01/2015 [DATE]
Date:

Service(s) Provided

Indications

First trimester aneuploidy screen (NT)                 Z36
13 weeks gestation of pregnancy
Fetal Evaluation

Num Of             1
Fetuses:
Fetal Heart        158                          bpm
Rate:
Cardiac Activity:  Observed
Presentation:      Variable
Placenta:          Posterior, above cervical
os

Amniotic Fluid
AFI FV:      Subjectively within normal limits
Larg Pckt:      2.8  cm
Biometry

CRL:     77.2   m    G. Age:   13w 3d                  EDD:   02/03/16
m
Gestational Age

LMP:           13w 5d        Date:  04/27/15                  EDD:   02/01/16
Best:          13w 5d    Det. By:   LMP  (04/27/15)           EDD:   02/01/16
Cervix Uterus Adnexa

Cervix:       Normal appearance by transabdominal scan.
Uterus:       No abnormality visualized.
Cul De Sac:   No free fluid seen.
Impression

Single IUP at 13w 5d
Unable to obtain an adequate NT due to fetal position
NT does appear to be normal; nasal bone visualized

After counseling, the patient elected to undergo Quad
screen
Recommendations

Plan to drawn quad screen when the patient returns for her
anatomy scan at 19 weeks.

## 2016-03-16 ENCOUNTER — Ambulatory Visit: Payer: BC Managed Care – PPO | Admitting: Obstetrics and Gynecology

## 2016-03-18 ENCOUNTER — Ambulatory Visit: Payer: BC Managed Care – PPO | Admitting: Obstetrics and Gynecology

## 2016-03-24 ENCOUNTER — Encounter: Payer: Self-pay | Admitting: *Deleted

## 2016-03-24 ENCOUNTER — Encounter: Payer: Self-pay | Admitting: Obstetrics and Gynecology

## 2016-03-24 ENCOUNTER — Ambulatory Visit (INDEPENDENT_AMBULATORY_CARE_PROVIDER_SITE_OTHER): Payer: BC Managed Care – PPO | Admitting: Obstetrics and Gynecology

## 2016-03-24 VITALS — BP 123/78 | HR 86 | Resp 16 | Ht 62.0 in | Wt 179.0 lb

## 2016-03-24 DIAGNOSIS — Z01812 Encounter for preprocedural laboratory examination: Secondary | ICD-10-CM | POA: Diagnosis not present

## 2016-03-24 DIAGNOSIS — Z30430 Encounter for insertion of intrauterine contraceptive device: Secondary | ICD-10-CM

## 2016-03-24 LAB — POCT URINE PREGNANCY: PREG TEST UR: NEGATIVE

## 2016-03-24 MED ORDER — LEVONORGESTREL 18.6 MCG/DAY IU IUD
INTRAUTERINE_SYSTEM | Freq: Once | INTRAUTERINE | Status: AC
  Administered 2016-03-24: 10:00:00 via INTRAUTERINE

## 2016-03-24 NOTE — Addendum Note (Signed)
Addended by: Arne ClevelandHUTCHINSON, MANDY J on: 03/24/2016 09:30 AM   Modules accepted: Orders

## 2016-03-24 NOTE — Patient Instructions (Signed)
Levonorgestrel intrauterine device (IUD) What is this medicine? LEVONORGESTREL IUD (LEE voe nor jes trel) is a contraceptive (birth control) device. The device is placed inside the uterus by a healthcare professional. It is used to prevent pregnancy and can also be used to treat heavy bleeding that occurs during your period. Depending on the device, it can be used for 3 to 5 years. This medicine may be used for other purposes; ask your health care provider or pharmacist if you have questions. What should I tell my health care provider before I take this medicine? They need to know if you have any of these conditions: -abnormal Pap smear -cancer of the breast, uterus, or cervix -diabetes -endometritis -genital or pelvic infection now or in the past -have more than one sexual partner or your partner has more than one partner -heart disease -history of an ectopic or tubal pregnancy -immune system problems -IUD in place -liver disease or tumor -problems with blood clots or take blood-thinners -use intravenous drugs -uterus of unusual shape -vaginal bleeding that has not been explained -an unusual or allergic reaction to levonorgestrel, other hormones, silicone, or polyethylene, medicines, foods, dyes, or preservatives -pregnant or trying to get pregnant -breast-feeding How should I use this medicine? This device is placed inside the uterus by a health care professional. Talk to your pediatrician regarding the use of this medicine in children. Special care may be needed. Overdosage: If you think you have taken too much of this medicine contact a poison control center or emergency room at once. NOTE: This medicine is only for you. Do not share this medicine with others. What if I miss a dose? This does not apply. What may interact with this medicine? Do not take this medicine with any of the following medications: -amprenavir -bosentan -fosamprenavir This medicine may also interact with  the following medications: -aprepitant -barbiturate medicines for inducing sleep or treating seizures -bexarotene -griseofulvin -medicines to treat seizures like carbamazepine, ethotoin, felbamate, oxcarbazepine, phenytoin, topiramate -modafinil -pioglitazone -rifabutin -rifampin -rifapentine -some medicines to treat HIV infection like atazanavir, indinavir, lopinavir, nelfinavir, tipranavir, ritonavir -St. John's wort -warfarin This list may not describe all possible interactions. Give your health care provider a list of all the medicines, herbs, non-prescription drugs, or dietary supplements you use. Also tell them if you smoke, drink alcohol, or use illegal drugs. Some items may interact with your medicine. What should I watch for while using this medicine? Visit your doctor or health care professional for regular check ups. See your doctor if you or your partner has sexual contact with others, becomes HIV positive, or gets a sexual transmitted disease. This product does not protect you against HIV infection (AIDS) or other sexually transmitted diseases. You can check the placement of the IUD yourself by reaching up to the top of your vagina with clean fingers to feel the threads. Do not pull on the threads. It is a good habit to check placement after each menstrual period. Call your doctor right away if you feel more of the IUD than just the threads or if you cannot feel the threads at all. The IUD may come out by itself. You may become pregnant if the device comes out. If you notice that the IUD has come out use a backup birth control method like condoms and call your health care provider. Using tampons will not change the position of the IUD and are okay to use during your period. What side effects may I notice from receiving this medicine?   Side effects that you should report to your doctor or health care professional as soon as possible: -allergic reactions like skin rash, itching or  hives, swelling of the face, lips, or tongue -fever, flu-like symptoms -genital sores -high blood pressure -no menstrual period for 6 weeks during use -pain, swelling, warmth in the leg -pelvic pain or tenderness -severe or sudden headache -signs of pregnancy -stomach cramping -sudden shortness of breath -trouble with balance, talking, or walking -unusual vaginal bleeding, discharge -yellowing of the eyes or skin Side effects that usually do not require medical attention (report to your doctor or health care professional if they continue or are bothersome): -acne -breast pain -change in sex drive or performance -changes in weight -cramping, dizziness, or faintness while the device is being inserted -headache -irregular menstrual bleeding within first 3 to 6 months of use -nausea This list may not describe all possible side effects. Call your doctor for medical advice about side effects. You may report side effects to FDA at 1-800-FDA-1088. Where should I keep my medicine? This does not apply. NOTE: This sheet is a summary. It may not cover all possible information. If you have questions about this medicine, talk to your doctor, pharmacist, or health care provider.    2016, [employer] Standard. (2011-11-18 13:54:04)  

## 2016-03-24 NOTE — Progress Notes (Signed)
  Subjective:     Lisa Vincent is a 25 y.o. female who presents for a postpartum visit. She is 8 weeks postpartum following a low cervical transverse Cesarean section. I have fully reviewed the prenatal and intrapartum course. The delivery was at 41 gestational weeks. Outcome: primary cesarean section, low transverse incision. Anesthesia: epidural. Postpartum course has been uncomplicated. Baby's course has been uncomplicated. Baby is feeding by both breast and bottle - Similac Advance. Bleeding staining only. Bowel function is normal. Bladder function is normal. Patient is sexually active. Contraception method is none. She used plan B last Sunday. Postpartum depression screening: negative.     Review of Systems Pertinent items are noted in HPI.   Objective:    BP 123/78 mmHg  Pulse 86  Resp 16  Ht 5\' 2"  (1.575 m)  Wt 179 lb (81.194 kg)  BMI 32.73 kg/m2  Breastfeeding? Yes  General:  alert, cooperative and no distress   Breasts:  inspection negative, no nipple discharge or bleeding, no masses or nodularity palpable  Lungs: clear to auscultation bilaterally  Heart:  regular rate and rhythm  Abdomen: soft, non-tender; bowel sounds normal; no masses,  no organomegaly and incision: no erythema, induration or drainage. Healed completely   Vulva:  normal  Vagina: normal vagina, no discharge, exudate, lesion, or erythema  Cervix:  multiparous appearance  Corpus: normal size, contour, position, consistency, mobility, non-tender  Adnexa:  normal adnexa and no mass, fullness, tenderness  Rectal Exam: Not performed.        Assessment:     Normal postpartum exam. Pap smear not done at today's visit (normal in 09/2014).   Plan:    1. Contraception: IUD  IUD Procedure Note Patient identified, informed consent performed, signed copy in chart, time out was performed.  Urine pregnancy test negative.  Speculum placed in the vagina.  Cervix visualized.  Cleaned with Betadine x 2.  Grasped  anteriorly with a single tooth tenaculum.  Uterus sounded to 8 cm.  Lyletta IUD placed per manufacturer's recommendations.  Strings trimmed to 3 cm. Tenaculum was removed, good hemostasis noted.  Patient tolerated procedure well.   Patient given post procedure instructions and Lyletta care card with expiration date.  Patient is asked to check IUD strings periodically   2. Patient is medically cleared to resume all activities of daily living 3. Follow up in: 4 weeks for IUD check or as needed.

## 2016-04-12 IMAGING — US US MFM OB COMP +14 WKS
2 series · 14 of 28 positions shown · non-contrast
Comparison: none

[Series 1: us mfm ob comp +14 wks · 13 of 70 slices shown (1 of 2)]
[im 3/70]
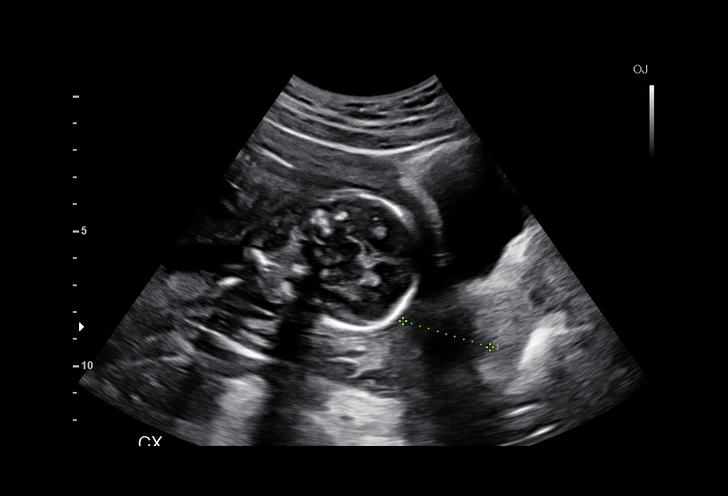
[im 9/70]
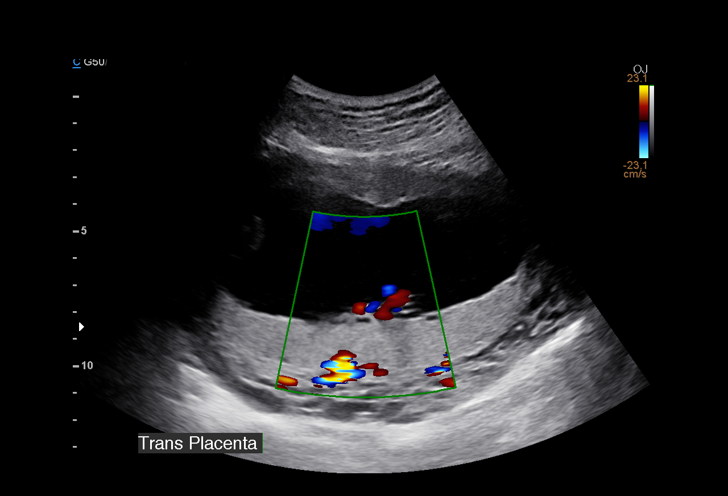
[im 14/70]
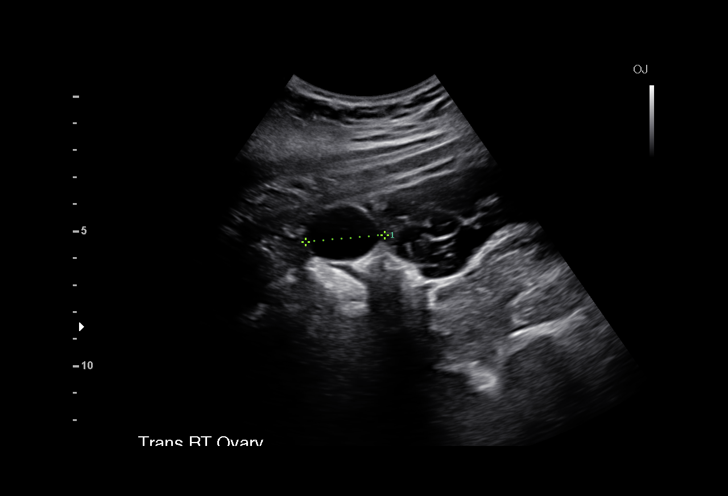
[im 20/70]
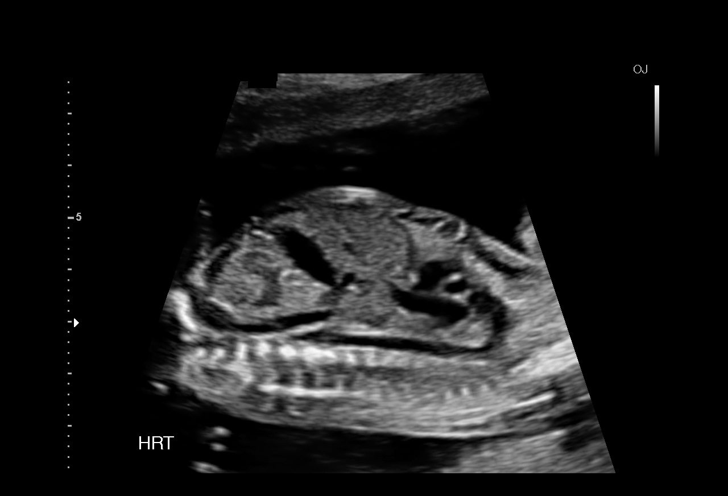
[im 25/70]
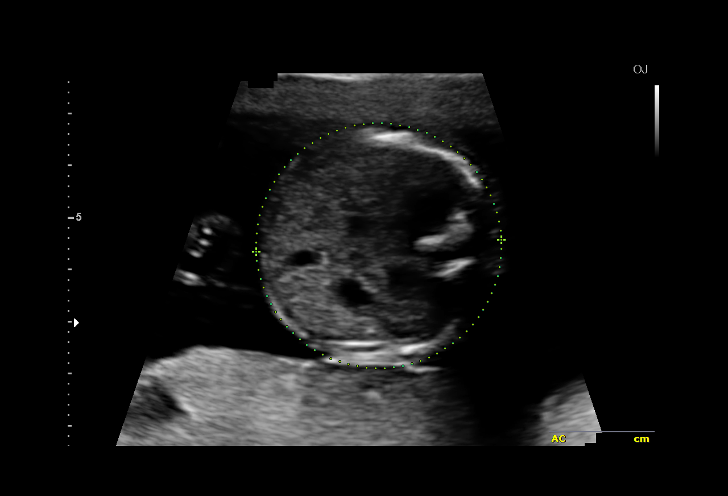
[im 31/70]
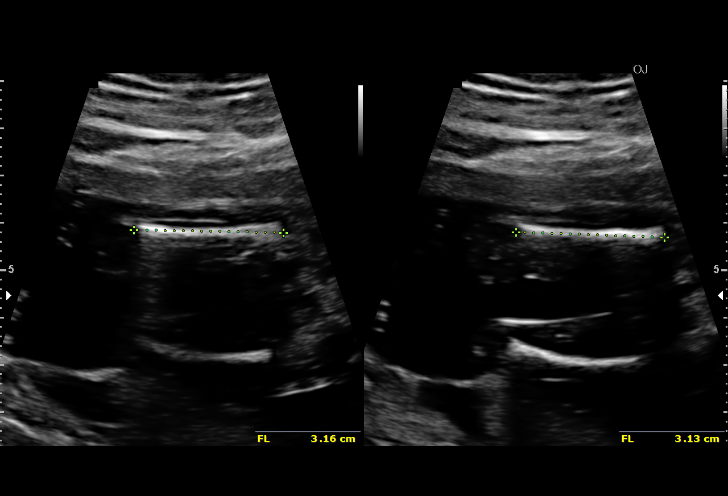
[im 36/70]
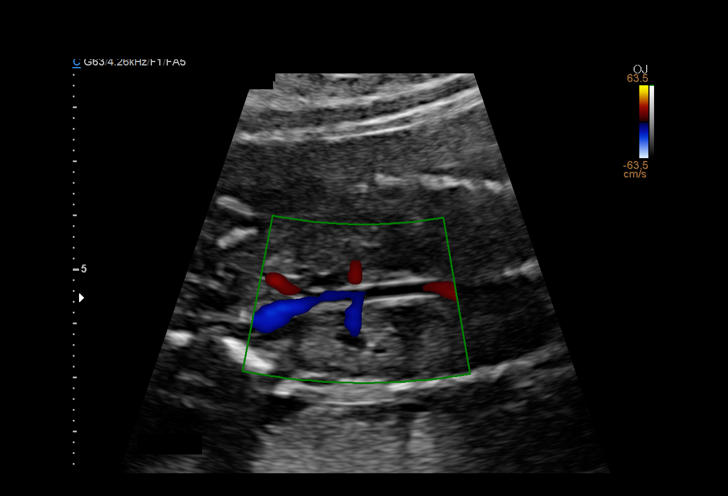
[im 42/70]
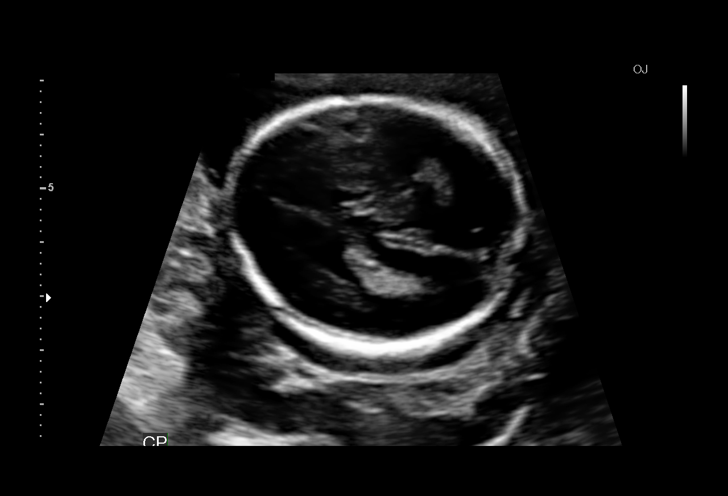
[im 47/70]
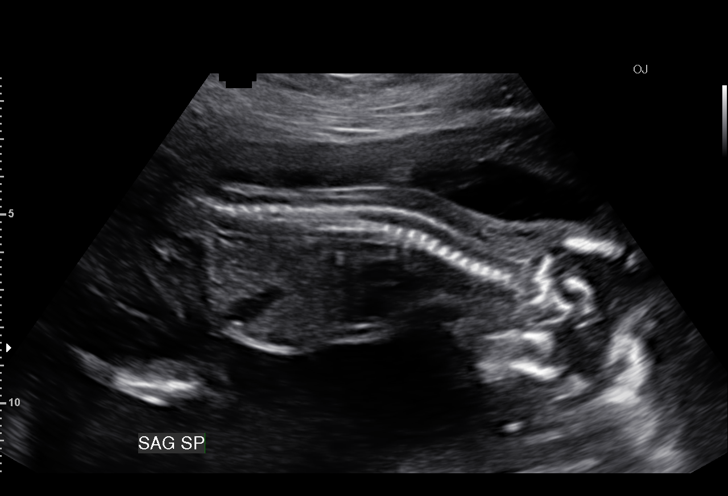
[im 53/70]
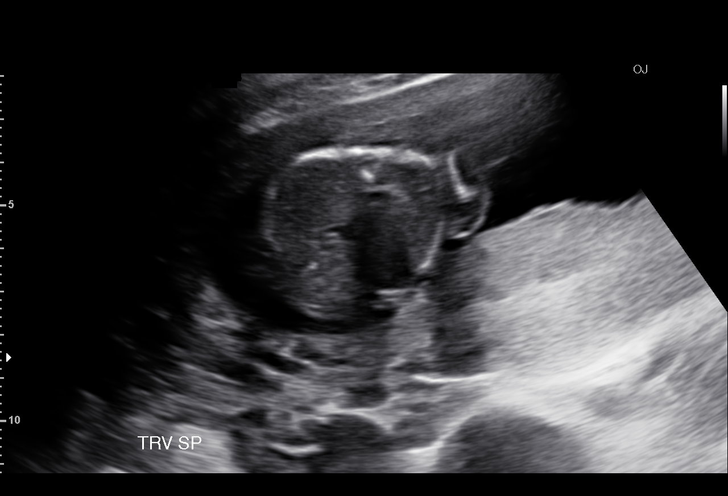
[im 58/70]
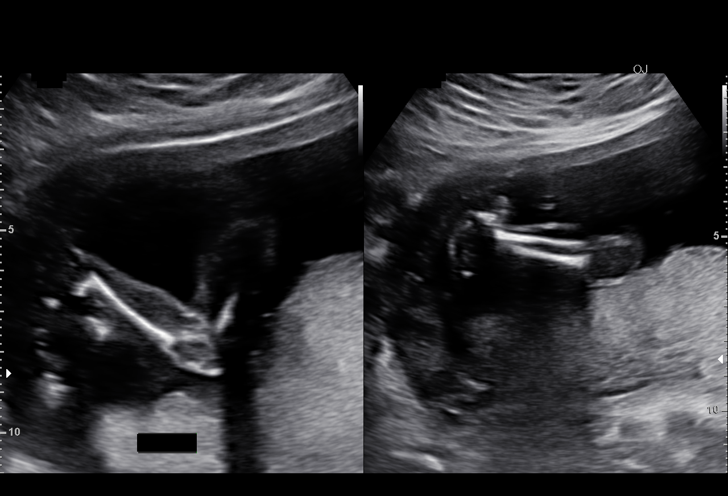
[im 64/70]
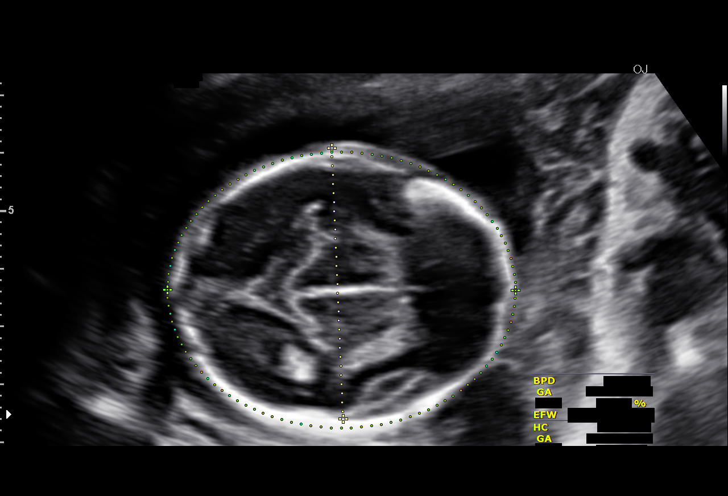
[im 70/70]
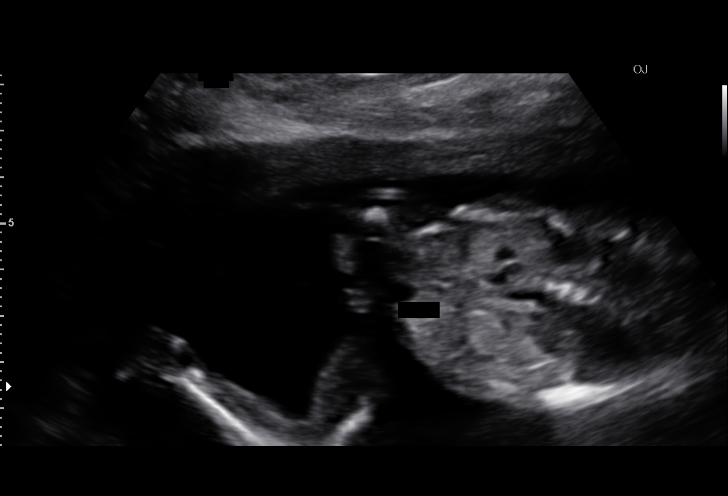

[Series 3: us mfm ob comp +14 wks · 4 acquisitions, 1 frame shown (2 of 2)]
[im 4/4]
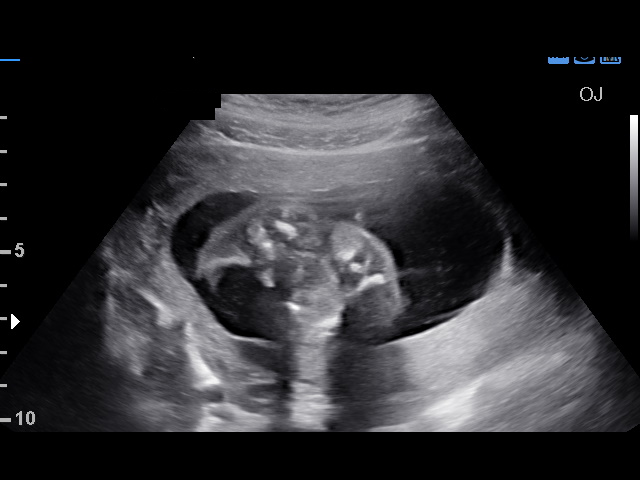

[14 of 28 positions shown; findings below may reference images not displayed]

OBSTETRICS REPORT
(Signed Final 09/09/2015 [DATE])

Name:       EJAZ EWING                          Visit  09/09/2015 [DATE]
Date:

By:
Service(s) Provided

Indications

19 weeks gestation of pregnancy
Basic anatomic survey                                  Z36
Fetal Evaluation

Num Of             1
Fetuses:
Cardiac Activity:  Observed
Presentation:      Cephalic
Placenta:          Posterior, above cervical
os
P. Cord            Visualized
Insertion:

Amniotic Fluid
AFI FV:      Subjectively within normal limits
Larg Pckt:      3.9  cm
Biometry

BPD:       47   m    G. Age:   20w 1d                 CI:        75.99   70 - 86
m
FL/HC:      18.2   16.1 -
18.3
HC:     170.9   m    G. Age:   19w 5d        61  %    HC/AC:      1.15   1.09 -
m
AC:     148.2   m    G. Age:   20w 1d        72  %    FL/BPD
m                                     :
FL:      31.1   m    G. Age:   19w 4d        56  %    FL/AC:      21.0   20 - 24
m

Est.         318   gm   0 lb 11 oz      55   %
FW:
Gestational Age
LMP:           19w 2d        Date:  04/27/15                  EDD:   02/01/16
U/S Today:     19w 6d                                         EDD:   01/28/16
Best:          19w 2d    Det. By:   LMP  (04/27/15)           EDD:   02/01/16
Anatomy

Cranium:          Appears normal         Aortic Arch:       Appears normal
Fetal Cavum:      Appears normal         Ductal Arch:       Appears normal
Ventricles:       Appears normal         Diaphragm:         Appears normal
Choroid Plexus:   Appears normal         Stomach:           Appears normal,
left sided
Cerebellum:       Appears normal         Abdomen:           Appears normal
Posterior         Appears normal         Abdominal          Appears nml (cord
Fossa:                                   Wall:              insert, abd wall)
Nuchal Fold:      Appears normal         Cord Vessels:      Appears normal (3
vessel cord)
Face:             Orbits nl; profile     Kidneys:           Appear normal
not well visualized
Lips:             Appears normal         Bladder:           Appears normal
Heart:            Not well visualized    Spine:             Appears normal
RVOT:             Not well visualized    Lower              Appears normal
Extremities:
LVOT:             Appears normal         Upper              Appears normal
Extremities:

Other:   Parents do not wish to know sex of fetus. NB visualized. Fetus
appears to be a female. Heels visualized. Technically difficult due
to fetal position.
Cervix Uterus Adnexa

Cervical Length:    3.4       cm

Cervix:       Normal appearance by transabdominal scan.

Left Ovary:    Within normal limits.
Right Ovary:   Within normal limits.
Impression

Single IUP at 19w 2d
Limited views of the fetal heart were obtained
The remainder of the fetal anatomy appears normal
Ultrasound measurements are consistent with dates
Posterior placenta without previa
Normal amniotic fluid volume
Recommendations

Recommend follow-up ultrasound examination in 4 weeks
to reevaluate the fetal heart

## 2016-04-21 ENCOUNTER — Ambulatory Visit: Payer: BC Managed Care – PPO | Admitting: Obstetrics & Gynecology

## 2016-09-09 IMAGING — US US MFM FETAL BPP W/O NON-STRESS
1 series · 12 of 12 positions shown · non-contrast
Comparison: none

[Series 1: us mfm fetal bpp w/o non-stress · 12 acquisitions, 12 frames shown]
[im 1/12]
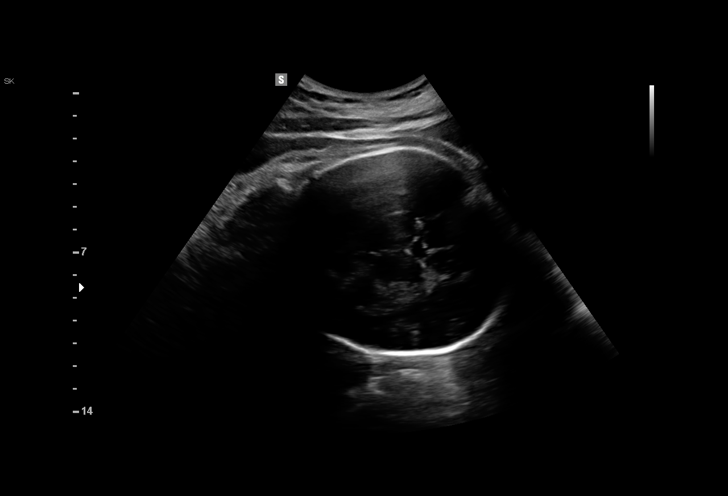
[im 2/12]
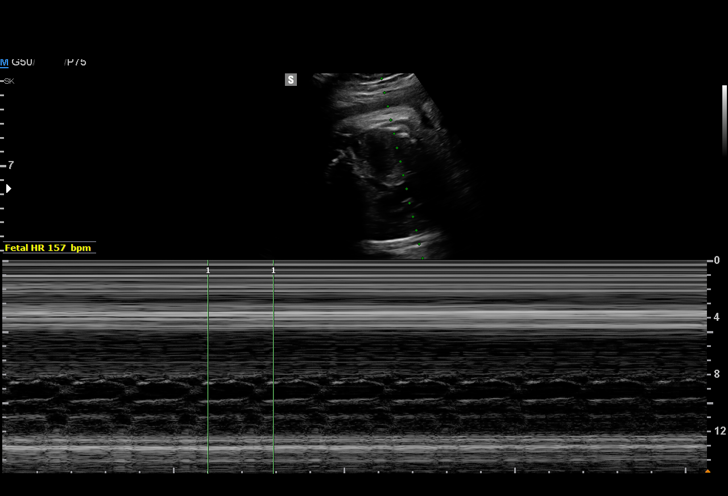
[im 3/12]
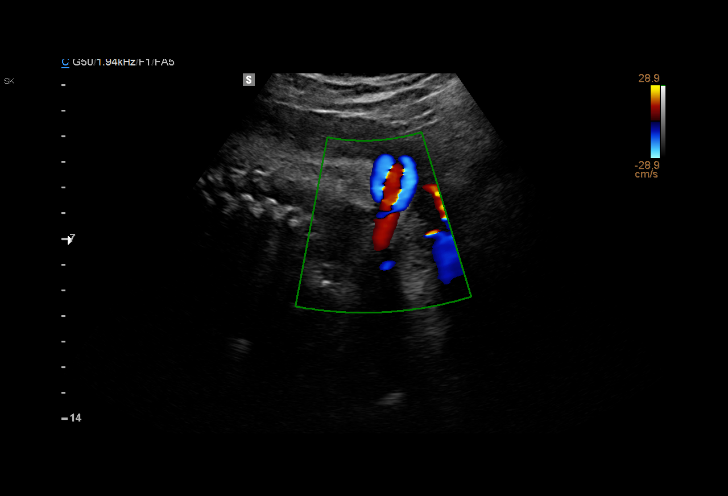
[im 4/12]
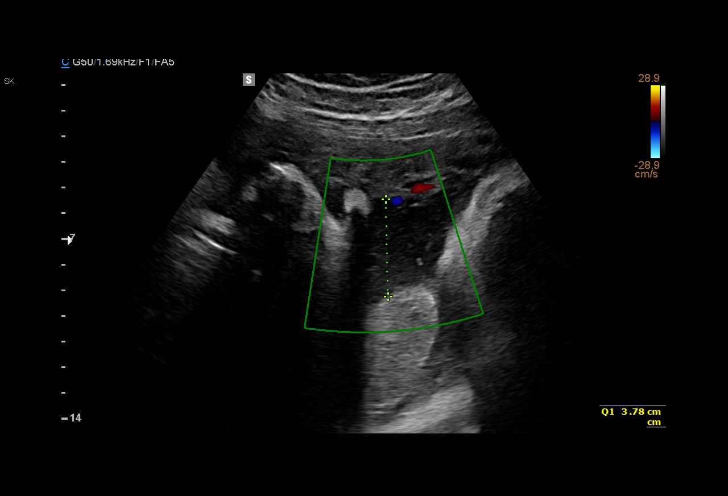
[im 5/12]
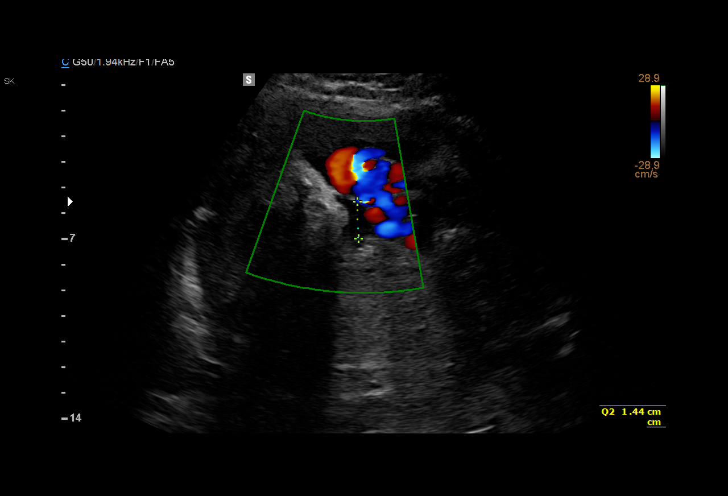
[im 6/12]
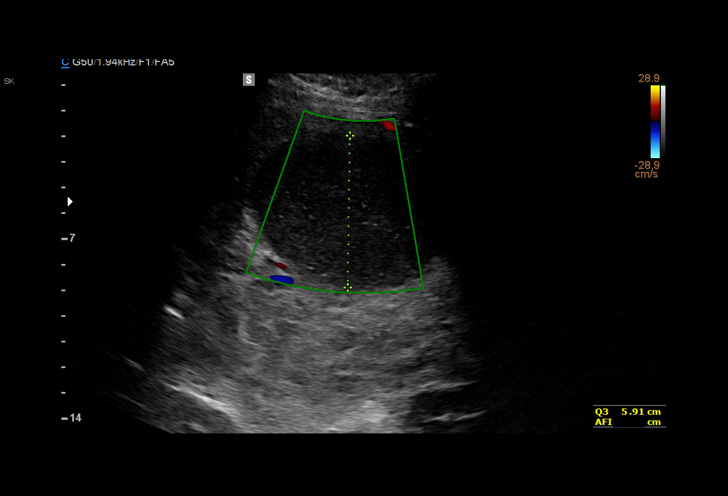
[im 7/12]
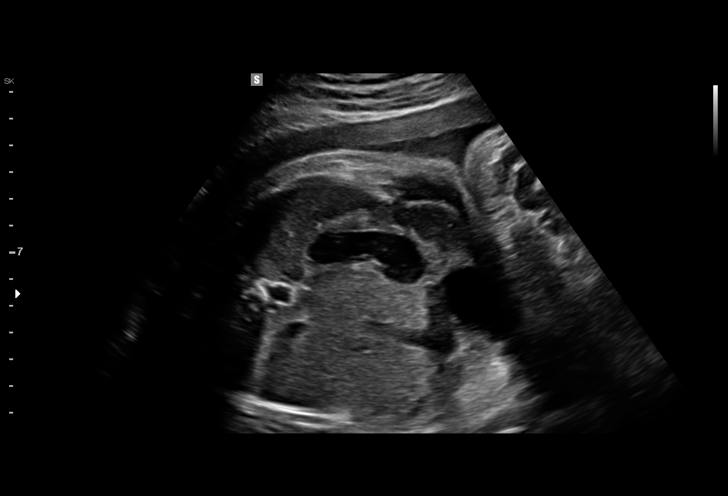
[im 8/12]
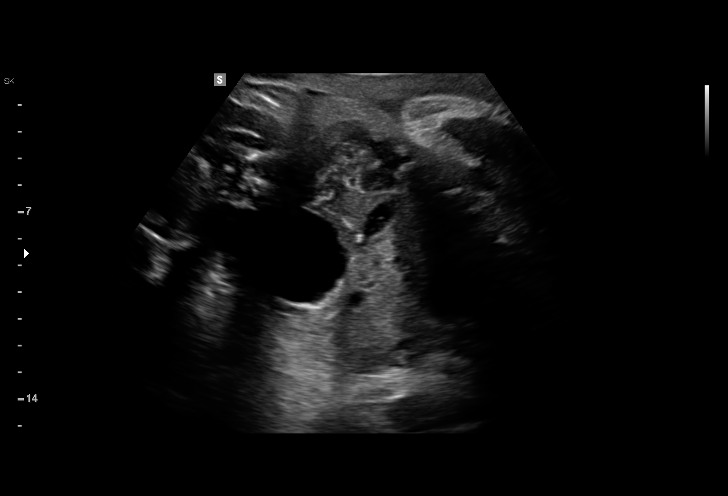
[im 9/12]
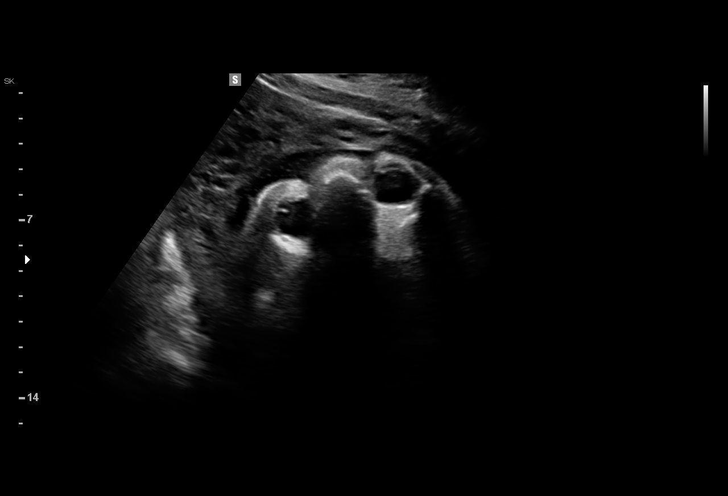
[im 10/12]
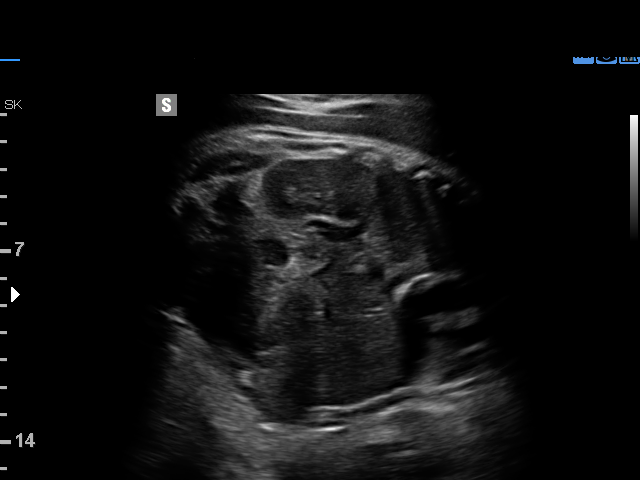
[im 11/12]
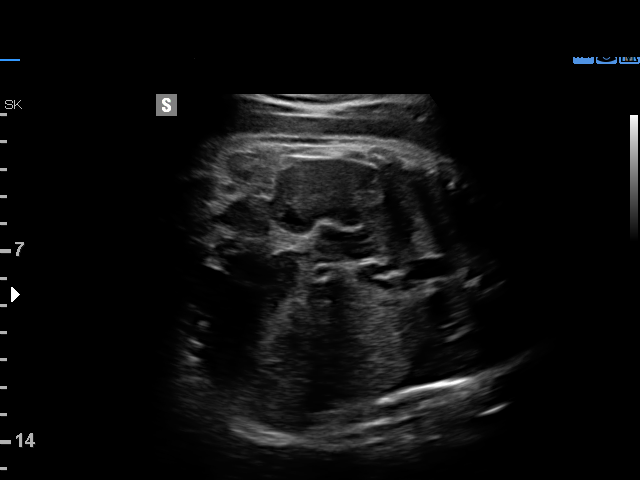
[im 12/12]
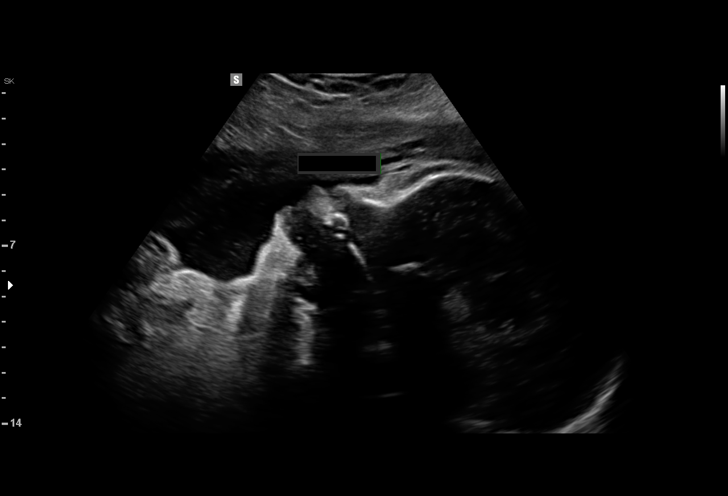

[12 of 12 positions shown; findings below may reference images not displayed]

[REDACTED]

1  HLENGIE                  [id number]      [id number]     [id number]
DICKS
EVALDS
Indications

Non-reactive NST
Decreased fetal movement
Postdate pregnancy (40-42 weeks)
40 weeks gestation of pregnancy
OB History

Gravidity:    2         Term:   0        Prem:   0        SAB:   0
TOP:          1       Ectopic:  0        Living: 0
Fetal Evaluation

Num Of Fetuses:     1
Fetal Heart         157
Rate(bpm):
Cardiac Activity:   Observed
Presentation:       Cephalic

Amniotic Fluid
AFI FV:      Subjectively low-normal
AFI Sum:     11      cm       42  %Tile
Biophysical Evaluation

Amniotic F.V:   Within normal limits       F. Tone:        Observed
F. Movement:    Observed                   Score:          [DATE]
F. Breathing:   Observed
Gestational Age

LMP:           40w 5d        Date:  04/27/15                 EDD:   02/01/16
Anatomy

Stomach:          Appears normal, left   Bladder:          Appears normal
sided
Impression

Single IUP at 40w 5d
Cephalic presentation
BPP [DATE]
Normal amniotic fluid volume (AFI 11 cm)
Recommendations

Follow-up ultrasounds as clinically indicated.
